# Patient Record
Sex: Female | Born: 1955 | Race: White | Hispanic: No | Smoking: Current every day smoker
Health system: Southern US, Community
[De-identification: ages and names within clinical notes are randomized; demographics above are authoritative.]

## PROBLEM LIST (undated history)

## (undated) DIAGNOSIS — F329 Major depressive disorder, single episode, unspecified: Secondary | ICD-10-CM

## (undated) DIAGNOSIS — R112 Nausea with vomiting, unspecified: Secondary | ICD-10-CM

## (undated) DIAGNOSIS — G43909 Migraine, unspecified, not intractable, without status migrainosus: Secondary | ICD-10-CM

## (undated) DIAGNOSIS — K219 Gastro-esophageal reflux disease without esophagitis: Secondary | ICD-10-CM

## (undated) DIAGNOSIS — F419 Anxiety disorder, unspecified: Secondary | ICD-10-CM

## (undated) DIAGNOSIS — M1990 Unspecified osteoarthritis, unspecified site: Secondary | ICD-10-CM

## (undated) DIAGNOSIS — G971 Other reaction to spinal and lumbar puncture: Secondary | ICD-10-CM

## (undated) DIAGNOSIS — R0602 Shortness of breath: Secondary | ICD-10-CM

## (undated) DIAGNOSIS — E7800 Pure hypercholesterolemia, unspecified: Secondary | ICD-10-CM

## (undated) DIAGNOSIS — R51 Headache: Secondary | ICD-10-CM

## (undated) DIAGNOSIS — J189 Pneumonia, unspecified organism: Secondary | ICD-10-CM

## (undated) DIAGNOSIS — F32A Depression, unspecified: Secondary | ICD-10-CM

## (undated) DIAGNOSIS — I10 Essential (primary) hypertension: Secondary | ICD-10-CM

## (undated) DIAGNOSIS — Z9889 Other specified postprocedural states: Secondary | ICD-10-CM

## (undated) HISTORY — PX: JOINT REPLACEMENT: SHX530

## (undated) HISTORY — PX: FRACTURE SURGERY: SHX138

## (undated) HISTORY — PX: TONSILLECTOMY AND ADENOIDECTOMY: SUR1326

## (undated) HISTORY — PX: ANKLE FRACTURE SURGERY: SHX122

---

## 1981-08-10 HISTORY — PX: LEFT OOPHORECTOMY: SHX1961

## 1983-08-11 HISTORY — PX: TUBAL LIGATION: SHX77

## 1986-08-10 HISTORY — PX: KNEE SURGERY: SHX244

## 1998-01-18 ENCOUNTER — Other Ambulatory Visit: Admission: RE | Admit: 1998-01-18 | Discharge: 1998-01-18 | Payer: Self-pay | Admitting: Obstetrics

## 2008-08-23 ENCOUNTER — Inpatient Hospital Stay (HOSPITAL_COMMUNITY): Admission: RE | Admit: 2008-08-23 | Discharge: 2008-08-26 | Payer: Self-pay | Admitting: Orthopedic Surgery

## 2010-11-24 LAB — URINALYSIS, ROUTINE W REFLEX MICROSCOPIC
Glucose, UA: NEGATIVE mg/dL
Leukocytes, UA: NEGATIVE
pH: 5 (ref 5.0–8.0)

## 2010-11-24 LAB — URINE MICROSCOPIC-ADD ON

## 2010-11-24 LAB — COMPREHENSIVE METABOLIC PANEL
ALT: 26 U/L (ref 0–35)
AST: 26 U/L (ref 0–37)
BUN: 19 mg/dL (ref 6–23)
Calcium: 10.3 mg/dL (ref 8.4–10.5)
Chloride: 106 mEq/L (ref 96–112)
Creatinine, Ser: 0.94 mg/dL (ref 0.4–1.2)
GFR calc non Af Amer: >60 mL/min (ref >60)
Potassium: 4.8 mEq/L (ref 3.5–5.1)
Total Protein: 7.2 g/dL (ref 6.0–8.3)

## 2010-11-24 LAB — PROTIME-INR
INR: 1 (ref 0.00–1.49)
Prothrombin Time: 12.7 seconds (ref 11.6–15.2)
Prothrombin Time: 13.6 seconds (ref 11.6–15.2)
Prothrombin Time: 33.8 seconds — ABNORMAL HIGH (ref 11.6–15.2)

## 2010-11-24 LAB — APTT: aPTT: 31 seconds (ref 24–37)

## 2010-11-24 LAB — CBC
HCT: 45.1 % (ref 36.0–46.0)
Hemoglobin: 15.3 g/dL — ABNORMAL HIGH (ref 12.0–15.0)
Platelets: 256 10*3/uL (ref 150–400)
WBC: 6.3 10*3/uL (ref 4.0–10.5)

## 2010-12-23 NOTE — Op Note (Signed)
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   POSTOPERATIVE DIAGNOSIS:  Osteoarthritis, right ankle secondary to  trauma from previous ankle fracture.   PROCEDURE:  1. Removal of deep-retained hardware.  2. Tibiocalcaneal fusion with a Synthes nail, 10 x 180 mm in length,      locked proximally and distally.   SURGEON:  Nadara Mustard, MD   ANESTHESIA:  General.   ESTIMATED BLOOD LOSS:  Minimal.   ANTIBIOTICS:  Kefzol 2 g.   DRAINS:  None.   COMPLICATIONS:  None.   TOURNIQUET TIME:  None.   DISPOSITION:  To PACU in stable condition.   INDICATIONS FOR PROCEDURE:  The patient is a 55 year old woman who is  status post bimalleolar ankle fracture on the right.  She has undergone  previous open reduction and internal fixation.  However, she has  developed progressive collapse and traumatic arthritis across the right  ankle joint.  She has failed conservative care.  She has pain with  activities of daily living and presents at this time for fusion.  Risks  and benefits were discussed including infection, neurovascular injury,  persistent pain, and need for additional surgery.  The patient states  she understands and wished to proceed at this time.   DESCRIPTION OF PROCEDURE:  The patient was brought to OR room #15 after  undergoing a popliteal block.  She then underwent a general anesthetic.  After adequate level of anesthesia obtained, the patient was placed in  the left lateral decubitus position with the right side up and the right  lower extremity was prepped using  DuraPrep and draped into a sterile  field.  A lateral incision was made laterally over a previous incision  and this was carried down to the fibula.  The oscillating saw was used  to resect the fibula proximal to the deep-retained hardware in the  distal fibula and the hardware was removed in 1 block of tissue.  The  fibrinous tissue around the ankle joint was debrided.  There were 2  fiber wire sutures for a tight rope syndesmosis repair.  There was also  calcification of the syndesmosis.  The calcification was taken down and  the tight robes were removed.  The distal tibia and talar dome were  resected perpendicular to long axis of the tibia with the ankle at 9  degrees.  This was removed in a block of tissue.  Her previous medial  incision was also used and this was carried down.  The medial hardware  was removed and a medial malleolus excision was also performed to allow  for reduction of the ankle.  This allowed the ankle  joint to be reduced  at 90 degrees.  An incision was made plantarly.  Guidewire was inserted  from the calcaneus up into the tibia and this was then overreamed with C-  arm fluoroscopy guidance up to 13 mm.  The 10-mm nail was then inserted  and the ankle was reduced.  This was locked with a 75-mm spiral blade  distally to the calcaneus and locked proximally with a 32-mm 5.0  cortical screw.  C-arm fluoroscopy verified reduction of both AP and  lateral planes.  The wounds were irrigated with normal saline.  The  fusion site was packed with the extra small Infuse graft.  Subcu was  closed using 2-0 Vicryl.  The skin was closed using approximated staples  as well as 2-0 nylon.  The wounds were covered with Adaptic orthopedic  sponges, ABD dressing, Kerlix, and Coban.  The patient was extubated and  taken to the PACU in stable condition.  Planned for discharge to home  once she was safe with ambulation.      Nadara Mustard, MD  Electronically Signed      MVD/MEDQ  D:  08/23/2008  T:  08/24/2008  Job:  [id number]

## 2010-12-26 NOTE — Discharge Summary (Signed)
[name], Dana Gilmore                 ACCOUNT NO.:  [id number]   MEDICAL RECORD NO.:  [id number]          PATIENT TYPE:  INP   LOCATION:  5023                         FACILITY:  [facility]   PHYSICIAN:  Nadara Mustard, MD     DATE OF BIRTH:  01-02-1956   DATE OF ADMISSION:  08/23/2008  DATE OF DISCHARGE:  08/26/2008                               DISCHARGE SUMMARY   FINAL DIAGNOSIS:  Osteoarthritis, right ankle secondary to trauma with  deep retained hardware.   PROCEDURE:  1. Removal of deep retained hardware.  2. Tibiocalcaneal fusion.   Discharge to home in stable condition with home health physical therapy.  Follow up in the office in 1 week after discharge.   HISTORY OF PRESENT ILLNESS:  The patient is a 55 year old woman with  osteoarthritis of the right ankle secondary to trauma.  She has pain  with activities of daily living, unstable leg/left hindfoot presents at  this time for tibiocalcaneal fusion.   The patient's hospital course is essentially unremarkable.  She  underwent tibiocalcaneal fusion on August 23, 2008, after removal of  the deep hardware.  She received general anesthetic as well as a  popliteal block.  She received Kefzol for infection prophylaxis and was  started on Coumadin for DVT prophylaxis.  Tourniquet was not used during  the surgery.  Postoperatively, the patient progressed slowly with her  therapy.  She was ambulating independently on the date of discharge and  was discharged to home in stable condition on August 26, 2008, with  follow up in the office in 1 week, non-weightbearing on the right foot.      Nadara Mustard, MD  Electronically Signed     MVD/MEDQ  D:  10/10/2008  T:  10/10/2008  Job:  [id number]

## 2011-09-14 ENCOUNTER — Emergency Department (INDEPENDENT_AMBULATORY_CARE_PROVIDER_SITE_OTHER): Payer: Medicaid Other

## 2011-09-14 ENCOUNTER — Encounter (HOSPITAL_BASED_OUTPATIENT_CLINIC_OR_DEPARTMENT_OTHER): Payer: Self-pay | Admitting: *Deleted

## 2011-09-14 ENCOUNTER — Emergency Department (HOSPITAL_BASED_OUTPATIENT_CLINIC_OR_DEPARTMENT_OTHER)
Admission: EM | Admit: 2011-09-14 | Discharge: 2011-09-14 | Discharge status: Against Medical Advice | Payer: Medicaid Other | Attending: Emergency Medicine | Admitting: Emergency Medicine

## 2011-09-14 DIAGNOSIS — M25569 Pain in unspecified knee: Secondary | ICD-10-CM | POA: Insufficient documentation

## 2011-09-14 DIAGNOSIS — M25469 Effusion, unspecified knee: Secondary | ICD-10-CM

## 2011-09-14 HISTORY — DX: Unspecified osteoarthritis, unspecified site: M19.90

## 2011-09-14 HISTORY — DX: Anxiety disorder, unspecified: F41.9

## 2011-09-14 HISTORY — DX: Pure hypercholesterolemia, unspecified: E78.00

## 2011-09-14 HISTORY — DX: Essential (primary) hypertension: I10

## 2011-09-14 NOTE — ED Notes (Signed)
Chronic pain. Here today with pain in her right knee. 2 days ago a person tripped over her knee. This am she had swelling.

## 2011-09-14 NOTE — ED Notes (Signed)
Pt not in waiting area. Told registration she had to leave due to transportation issues but she will return tomorrow.

## 2011-09-21 ENCOUNTER — Emergency Department (INDEPENDENT_AMBULATORY_CARE_PROVIDER_SITE_OTHER): Payer: Medicaid Other

## 2011-09-21 ENCOUNTER — Encounter (HOSPITAL_BASED_OUTPATIENT_CLINIC_OR_DEPARTMENT_OTHER): Payer: Self-pay | Admitting: *Deleted

## 2011-09-21 ENCOUNTER — Emergency Department (HOSPITAL_BASED_OUTPATIENT_CLINIC_OR_DEPARTMENT_OTHER)
Admission: EM | Admit: 2011-09-21 | Discharge: 2011-09-21 | Disposition: A | Discharge status: Home / Self Care | Payer: Medicaid Other | Attending: Emergency Medicine | Admitting: Emergency Medicine

## 2011-09-21 DIAGNOSIS — F411 Generalized anxiety disorder: Secondary | ICD-10-CM | POA: Insufficient documentation

## 2011-09-21 DIAGNOSIS — IMO0002 Reserved for concepts with insufficient information to code with codable children: Secondary | ICD-10-CM | POA: Insufficient documentation

## 2011-09-21 DIAGNOSIS — M25469 Effusion, unspecified knee: Secondary | ICD-10-CM

## 2011-09-21 DIAGNOSIS — M25569 Pain in unspecified knee: Secondary | ICD-10-CM

## 2011-09-21 DIAGNOSIS — M7989 Other specified soft tissue disorders: Secondary | ICD-10-CM

## 2011-09-21 DIAGNOSIS — Y93E3 Activity, vacuuming: Secondary | ICD-10-CM | POA: Insufficient documentation

## 2011-09-21 DIAGNOSIS — M1710 Unilateral primary osteoarthritis, unspecified knee: Secondary | ICD-10-CM

## 2011-09-21 DIAGNOSIS — I10 Essential (primary) hypertension: Secondary | ICD-10-CM | POA: Insufficient documentation

## 2011-09-21 DIAGNOSIS — E7800 Pure hypercholesterolemia, unspecified: Secondary | ICD-10-CM | POA: Insufficient documentation

## 2011-09-21 DIAGNOSIS — M2340 Loose body in knee, unspecified knee: Secondary | ICD-10-CM

## 2011-09-21 DIAGNOSIS — S8990XA Unspecified injury of unspecified lower leg, initial encounter: Secondary | ICD-10-CM

## 2011-09-21 DIAGNOSIS — F17200 Nicotine dependence, unspecified, uncomplicated: Secondary | ICD-10-CM | POA: Insufficient documentation

## 2011-09-21 DIAGNOSIS — Y92009 Unspecified place in unspecified non-institutional (private) residence as the place of occurrence of the external cause: Secondary | ICD-10-CM | POA: Insufficient documentation

## 2011-09-21 DIAGNOSIS — G8929 Other chronic pain: Secondary | ICD-10-CM | POA: Insufficient documentation

## 2011-09-21 MED ORDER — OXYCODONE-ACETAMINOPHEN 5-325 MG PO TABS
1.0000 | ORAL_TABLET | Freq: Once | ORAL | Status: AC
  Administered 2011-09-21: 1 via ORAL
  Filled 2011-09-21: qty 1

## 2011-09-21 NOTE — ED Provider Notes (Signed)
Medical screening examination/treatment/procedure(s) were performed by non-physician practitioner and as supervising physician I was immediately available for consultation/collaboration.  Addilynne Olheiser R. Raelene Trew, MD 09/21/11 1443 

## 2011-09-21 NOTE — ED Notes (Signed)
The patient know and stated that she understands that with the knee immobilizer on her leg,and her leg brace will not fit at the same time. She is unable to use both of the braces together.

## 2011-09-21 NOTE — ED Provider Notes (Signed)
History     CSN: [id number]  Arrival date & time 09/21/11  8657   First MD Initiated Contact with Patient 09/21/11 0957      Chief Complaint  Patient presents with  . Knee Pain    (Consider location/radiation/quality/duration/timing/severity/associated sxs/prior treatment) HPI  Pt presents to the ED with right knee pain. She came to the ER for right knee pain and had xrays done but left before being seen on 09/14/2011 after her husband hit her knee vacuuming. Last night the patient got up to go to the bathroom at 2am and her knee gave out. She was able to catch herself on the hand rails and did not fall. Since then she has not iced her knee. She is unable to put pressure on it. Upon entering exam room I note that their is significant swelling to proximal lateral side. She denies syncope causing the fall, denies any other injuries, or any other complaints. Patient has walker at home if she is able to use.  Past Medical History  Diagnosis Date  . Chronic pain   . Hypertension   . High cholesterol   . Anxiety   . Arthritis     Past Surgical History  Procedure Date  . Knee surgery   . Tonsillectomy     History reviewed. No pertinent family history.  History  Substance Use Topics  . Smoking status: Current Everyday Smoker -- 0.5 packs/day  . Smokeless tobacco: Not on file  . Alcohol Use: No    OB History    Grav Para Term Preterm Abortions TAB SAB Ect Mult Living                  Review of Systems  All other systems reviewed and are negative.    Allergies  Ibuprofen; Lyrica; Neurontin; and Tylenol  Home Medications   Current Outpatient Rx  Name Route Sig Dispense Refill  . VENTOLIN IN Inhalation Inhale into the lungs.    . AMLODIPINE BESYLATE PO Oral Take by mouth.    . CLONAZEPAM PO Oral Take by mouth.    . DICLOFENAC SODIUM 1 % TD GEL Topical Apply topically.    Marland Kitchen NORTRIPTYLINE HCL PO Oral Take by mouth.    . OMEPRAZOLE PO Oral Take by mouth.    .  OXYCODONE HCL PO Oral Take by mouth.    Marland Kitchen SIMVASTATIN PO Oral Take by mouth.      BP 138/88  Pulse 86  Temp(Src) 98.5 F (36.9 C) (Oral)  Resp 18  SpO2 96%  Physical Exam  Constitutional: She is oriented to person, place, and time. She appears well-developed and well-nourished.  HENT:  Head: Normocephalic and atraumatic.  Eyes: Pupils are equal, round, and reactive to light.  Neck: Trachea normal, normal range of motion and full passive range of motion without pain. Neck supple.  Cardiovascular: Normal rate, regular rhythm and normal pulses.   Pulmonary/Chest: Effort normal. Chest wall is not dull to percussion. She exhibits no crepitus, no edema, no deformity and no retraction.  Abdominal: Soft. Normal appearance.  Musculoskeletal:       Right knee: She exhibits decreased range of motion, swelling and effusion. She exhibits no ecchymosis, no deformity, no laceration, no erythema, normal alignment, no LCL laxity, normal patellar mobility and no bony tenderness. tenderness found. LCL tenderness noted. No patellar tendon tenderness noted.       Legs: Neurological: She is oriented to person, place, and time. She has normal strength.  Skin: Skin  is warm, dry and intact.  Psychiatric: Her speech is normal. Cognition and memory are normal.    ED Course  Procedures (including critical care time)  Labs Reviewed - No data to display Dg Knee Complete 4 Views Right  09/21/2011  *RADIOLOGY REPORT*  Clinical Data: Knee pain, swelling  RIGHT KNEE - COMPLETE 4+ VIEW  Comparison: Right knee films of 09/14/2011  Findings: As noted previously there is moderate tricompartmental degenerative joint disease with loss of joint space and spurring. On several views there is suggestion of a possible loose body within the joint space.  Patellar spurring is noted superiorly and there is a moderate sized left knee joint effusion present.  No evidence of dislocation of the patella is seen.  IMPRESSION:  1.  No  dislocation is seen. 2.  Moderate tricompartmental degenerative joint disease. 3.  Moderate right knee joint effusion. 4.  Question loose body and joint space.  Original Report Authenticated By: Juline Patch, M.D.     1. Loose body in knee   2. Knee injury       MDM     Patient Complaints #1 right knee pain  Plan  Pt placed in knee immobilizer and to call orthopedist (Dr. Lajoyce Corners) today. Pt has percocet at home. Pt has walker at home. Pt informed to rest, ice and elevate knee as much as possible  Referrals [facility], Dr. Lajoyce Corners  Prescriptions None  Continue these medications as noted  My, Rinke Medication Instructions ZOX:[id number]   Printed on:09/21/11 1114  Medication Information                    Albuterol (VENTOLIN IN) Inhale into the lungs.           AMLODIPINE BESYLATE PO Take by mouth.           CLONAZEPAM PO Take by mouth.           diclofenac sodium (VOLTAREN) 1 % GEL Apply topically.           NORTRIPTYLINE HCL PO Take by mouth.           OMEPRAZOLE PO Take by mouth.           OXYCODONE HCL PO Take by mouth.           SIMVASTATIN PO Take by mouth.             Pt has been given return to ER precautions. Pt is agreeable with plan and will return to the ED as needed for any changing or worsening of symptoms.         Dorthula Matas, PA 09/21/11 1118

## 2011-09-21 NOTE — ED Notes (Signed)
Pt to room 3 in w/c, reports twisting her knee when her husband fell over her leg brace on 2/2, has had increasing pain since then. Pt states that last night "it just give out" pt states she did not fall to the ground, but stumbled and caught her balance.

## 2011-11-18 ENCOUNTER — Other Ambulatory Visit (HOSPITAL_COMMUNITY): Payer: Self-pay | Admitting: Orthopedic Surgery

## 2011-11-24 ENCOUNTER — Encounter (HOSPITAL_COMMUNITY): Payer: Self-pay | Admitting: Pharmacy Technician

## 2011-12-03 ENCOUNTER — Encounter (HOSPITAL_COMMUNITY)
Admission: RE | Admit: 2011-12-03 | Discharge: 2011-12-03 | Disposition: A | Discharge status: Home / Self Care | Payer: Medicaid Other | Source: Ambulatory Visit | Attending: Orthopedic Surgery | Admitting: Orthopedic Surgery

## 2011-12-03 ENCOUNTER — Encounter (HOSPITAL_COMMUNITY): Payer: Self-pay

## 2011-12-03 HISTORY — DX: Headache: R51

## 2011-12-03 HISTORY — DX: Gastro-esophageal reflux disease without esophagitis: K21.9

## 2011-12-03 HISTORY — DX: Other reaction to spinal and lumbar puncture: G97.1

## 2011-12-03 HISTORY — DX: Shortness of breath: R06.02

## 2011-12-03 HISTORY — DX: Depression, unspecified: F32.A

## 2011-12-03 HISTORY — DX: Major depressive disorder, single episode, unspecified: F32.9

## 2011-12-03 LAB — COMPREHENSIVE METABOLIC PANEL
AST: 24 U/L (ref 0–37)
Albumin: 4.1 g/dL (ref 3.5–5.2)
Alkaline Phosphatase: 119 U/L — ABNORMAL HIGH (ref 39–117)
Chloride: 101 mEq/L (ref 96–112)
Potassium: 4.4 mEq/L (ref 3.5–5.1)
Sodium: 140 mEq/L (ref 135–145)
Total Bilirubin: 0.3 mg/dL (ref 0.3–1.2)
Total Protein: 7.5 g/dL (ref 6.0–8.3)

## 2011-12-03 LAB — CBC
MCHC: 32.6 g/dL (ref 30.0–36.0)
Platelets: 238 10*3/uL (ref 150–400)
RDW: 14.6 % (ref 11.5–15.5)
WBC: 6.1 10*3/uL (ref 4.0–10.5)

## 2011-12-03 LAB — ABO/RH: ABO/RH(D): O POS

## 2011-12-03 LAB — TYPE AND SCREEN: Antibody Screen: NEGATIVE

## 2011-12-03 LAB — SURGICAL PCR SCREEN
MRSA, PCR: NEGATIVE
Staphylococcus aureus: NEGATIVE

## 2011-12-03 LAB — PROTIME-INR: Prothrombin Time: 13.1 seconds (ref 11.6–15.2)

## 2011-12-03 LAB — APTT: aPTT: 34 seconds (ref 24–37)

## 2011-12-03 NOTE — Progress Notes (Signed)
Dr furr called for current ekg  And stress test.

## 2011-12-03 NOTE — Pre-Procedure Instructions (Signed)
20 Dana Gilmore  12/03/2011   Your procedure is scheduled on:  12/09/11  Report to [facility] at 630 AM.  Call this number if you have problems the morning of surgery: [phone]   Remember:   Do not eat food:After Midnight.  May have clear liquids: up to 4 Hours before arrival.  Clear liquids include soda, tea, black coffee, apple or grape juice, broth.  Take these medicines the morning of surgery with A SIP OF WATER: all inhalers,prilosec,percocet   Do not wear jewelry, make-up or nail polish.  Do not wear lotions, powders, or perfumes. You may wear deodorant.  Do not shave 48 hours prior to surgery.  Do not bring valuables to the hospital.  Contacts, dentures or bridgework may not be worn into surgery.  Leave suitcase in the car. After surgery it may be brought to your room.  For patients admitted to the hospital, checkout time is 11:00 AM the day of discharge.   Patients discharged the day of surgery will not be allowed to drive home.  Name and phone number of your driver: family  Special Instructions: CHG Shower Use Special Wash: 1/2 bottle night before surgery and 1/2 bottle morning of surgery.   Please read over the following fact sheets that you were given: Pain Booklet, Coughing and Deep Breathing, Blood Transfusion Information, Total Joint Packet, MRSA Information and Surgical Site Infection Prevention

## 2011-12-08 MED ORDER — CEFAZOLIN SODIUM-DEXTROSE 2-3 GM-% IV SOLR
2.0000 g | INTRAVENOUS | Status: AC
  Administered 2011-12-09: 2 g via INTRAVENOUS
  Filled 2011-12-08: qty 50

## 2011-12-09 ENCOUNTER — Encounter (HOSPITAL_COMMUNITY)
Admission: RE | Disposition: A | Discharge status: Home Health | Payer: Self-pay | Source: Ambulatory Visit | Attending: Orthopedic Surgery

## 2011-12-09 ENCOUNTER — Encounter (HOSPITAL_COMMUNITY): Payer: Self-pay

## 2011-12-09 ENCOUNTER — Ambulatory Visit (HOSPITAL_COMMUNITY): Payer: Medicaid Other | Admitting: Anesthesiology

## 2011-12-09 ENCOUNTER — Encounter (HOSPITAL_COMMUNITY): Payer: Self-pay | Admitting: Anesthesiology

## 2011-12-09 ENCOUNTER — Inpatient Hospital Stay (HOSPITAL_COMMUNITY): Payer: Medicaid Other

## 2011-12-09 ENCOUNTER — Inpatient Hospital Stay (HOSPITAL_COMMUNITY)
Admission: RE | Admit: 2011-12-09 | Discharge: 2011-12-12 | DRG: 470 | Disposition: A | Discharge status: Home Health | Payer: Medicaid Other | Source: Ambulatory Visit | Attending: Orthopedic Surgery | Admitting: Orthopedic Surgery

## 2011-12-09 DIAGNOSIS — E7800 Pure hypercholesterolemia, unspecified: Secondary | ICD-10-CM | POA: Diagnosis present

## 2011-12-09 DIAGNOSIS — Z90710 Acquired absence of both cervix and uterus: Secondary | ICD-10-CM

## 2011-12-09 DIAGNOSIS — I10 Essential (primary) hypertension: Secondary | ICD-10-CM | POA: Diagnosis present

## 2011-12-09 DIAGNOSIS — F411 Generalized anxiety disorder: Secondary | ICD-10-CM | POA: Diagnosis present

## 2011-12-09 DIAGNOSIS — Z87891 Personal history of nicotine dependence: Secondary | ICD-10-CM

## 2011-12-09 DIAGNOSIS — M1710 Unilateral primary osteoarthritis, unspecified knee: Principal | ICD-10-CM | POA: Diagnosis present

## 2011-12-09 DIAGNOSIS — F329 Major depressive disorder, single episode, unspecified: Secondary | ICD-10-CM | POA: Diagnosis present

## 2011-12-09 DIAGNOSIS — R0602 Shortness of breath: Secondary | ICD-10-CM

## 2011-12-09 DIAGNOSIS — K219 Gastro-esophageal reflux disease without esophagitis: Secondary | ICD-10-CM | POA: Diagnosis present

## 2011-12-09 DIAGNOSIS — G8929 Other chronic pain: Secondary | ICD-10-CM | POA: Diagnosis present

## 2011-12-09 DIAGNOSIS — Z96659 Presence of unspecified artificial knee joint: Secondary | ICD-10-CM

## 2011-12-09 DIAGNOSIS — G43909 Migraine, unspecified, not intractable, without status migrainosus: Secondary | ICD-10-CM

## 2011-12-09 DIAGNOSIS — F3289 Other specified depressive episodes: Secondary | ICD-10-CM | POA: Diagnosis present

## 2011-12-09 HISTORY — DX: Other specified postprocedural states: Z98.890

## 2011-12-09 HISTORY — DX: Nausea with vomiting, unspecified: R11.2

## 2011-12-09 HISTORY — PX: REPLACEMENT TOTAL KNEE: SUR1224

## 2011-12-09 HISTORY — DX: Shortness of breath: R06.02

## 2011-12-09 HISTORY — DX: Pneumonia, unspecified organism: J18.9

## 2011-12-09 HISTORY — PX: TOTAL KNEE ARTHROPLASTY: SHX125

## 2011-12-09 HISTORY — DX: Migraine, unspecified, not intractable, without status migrainosus: G43.909

## 2011-12-09 SURGERY — ARTHROPLASTY, KNEE, TOTAL
Anesthesia: General | Site: Knee | Laterality: Right | Wound class: Clean

## 2011-12-09 MED ORDER — FENTANYL CITRATE 0.05 MG/ML IJ SOLN
INTRAMUSCULAR | Status: DC | PRN
  Administered 2011-12-09: 150 ug via INTRAVENOUS
  Administered 2011-12-09: 100 ug via INTRAVENOUS
  Administered 2011-12-09: 150 ug via INTRAVENOUS

## 2011-12-09 MED ORDER — ALBUTEROL SULFATE HFA 108 (90 BASE) MCG/ACT IN AERS: 2.0000 | INHALATION_SPRAY | Freq: Four times a day (QID) | RESPIRATORY_TRACT | Status: DC | PRN

## 2011-12-09 MED ORDER — METOCLOPRAMIDE HCL 10 MG PO TABS: 5.0000 mg | ORAL_TABLET | Freq: Three times a day (TID) | ORAL | Status: DC | PRN

## 2011-12-09 MED ORDER — FLUTICASONE PROPIONATE HFA 44 MCG/ACT IN AERO
1.0000 | INHALATION_SPRAY | Freq: Two times a day (BID) | RESPIRATORY_TRACT | Status: DC
  Administered 2011-12-09 – 2011-12-12 (×6): 1 via RESPIRATORY_TRACT
  Filled 2011-12-09: qty 10.6

## 2011-12-09 MED ORDER — ATORVASTATIN CALCIUM 20 MG PO TABS
20.0000 mg | ORAL_TABLET | Freq: Every day | ORAL | Status: DC
  Administered 2011-12-09 – 2011-12-11 (×3): 20 mg via ORAL
  Filled 2011-12-09 (×4): qty 1

## 2011-12-09 MED ORDER — SODIUM CHLORIDE 0.9 % IV SOLN
INTRAVENOUS | Status: DC
  Administered 2011-12-09: 20 mL/h via INTRAVENOUS

## 2011-12-09 MED ORDER — CLONAZEPAM 1 MG PO TABS
2.0000 mg | ORAL_TABLET | Freq: Two times a day (BID) | ORAL | Status: DC
  Administered 2011-12-09 – 2011-12-12 (×6): 2 mg via ORAL
  Filled 2011-12-09 (×4): qty 2
  Filled 2011-12-09 (×2): qty 1
  Filled 2011-12-09: qty 2

## 2011-12-09 MED ORDER — WARFARIN SODIUM 7.5 MG PO TABS
7.5000 mg | ORAL_TABLET | Freq: Once | ORAL | Status: AC
  Administered 2011-12-09: 7.5 mg via ORAL
  Filled 2011-12-09: qty 1

## 2011-12-09 MED ORDER — HYDROCODONE-ACETAMINOPHEN 10-325 MG PO TABS
1.0000 | ORAL_TABLET | ORAL | Status: DC | PRN
  Administered 2011-12-10 – 2011-12-12 (×10): 2 via ORAL
  Administered 2011-12-12: 1 via ORAL
  Filled 2011-12-09 (×12): qty 2

## 2011-12-09 MED ORDER — METHOCARBAMOL 500 MG PO TABS
500.0000 mg | ORAL_TABLET | Freq: Four times a day (QID) | ORAL | Status: DC | PRN
  Administered 2011-12-10 – 2011-12-12 (×7): 500 mg via ORAL
  Filled 2011-12-09 (×8): qty 1

## 2011-12-09 MED ORDER — BUPIVACAINE-EPINEPHRINE PF 0.5-1:200000 % IJ SOLN
INTRAMUSCULAR | Status: DC | PRN
  Administered 2011-12-09: 25 mL

## 2011-12-09 MED ORDER — BENAZEPRIL HCL 10 MG PO TABS
10.0000 mg | ORAL_TABLET | Freq: Every day | ORAL | Status: DC
  Filled 2011-12-09 (×3): qty 1

## 2011-12-09 MED ORDER — SUCCINYLCHOLINE CHLORIDE 20 MG/ML IJ SOLN
INTRAMUSCULAR | Status: DC | PRN
  Administered 2011-12-09: 140 mg via INTRAVENOUS

## 2011-12-09 MED ORDER — PROMETHAZINE HCL 25 MG PO TABS: 25.0000 mg | ORAL_TABLET | Freq: Four times a day (QID) | ORAL | Status: DC | PRN

## 2011-12-09 MED ORDER — PROPOFOL 10 MG/ML IV EMUL
INTRAVENOUS | Status: DC | PRN
  Administered 2011-12-09: 200 mg via INTRAVENOUS

## 2011-12-09 MED ORDER — METHOCARBAMOL 100 MG/ML IJ SOLN
500.0000 mg | INTRAVENOUS | Status: AC
  Administered 2011-12-09: 500 mg via INTRAVENOUS
  Filled 2011-12-09: qty 5

## 2011-12-09 MED ORDER — DEXTROSE 5 % IV SOLN
INTRAVENOUS | Status: DC | PRN
  Administered 2011-12-09: 09:00:00 via INTRAVENOUS

## 2011-12-09 MED ORDER — LACTATED RINGERS IV SOLN
INTRAVENOUS | Status: DC | PRN
  Administered 2011-12-09 (×3): via INTRAVENOUS

## 2011-12-09 MED ORDER — HYDROMORPHONE HCL PF 1 MG/ML IJ SOLN
0.2500 mg | INTRAMUSCULAR | Status: DC | PRN
  Administered 2011-12-09 (×4): 0.5 mg via INTRAVENOUS

## 2011-12-09 MED ORDER — WARFARIN VIDEO
1.0000 | Freq: Once | Status: AC
  Administered 2011-12-10: 1

## 2011-12-09 MED ORDER — WARFARIN - PHARMACIST DOSING INPATIENT: Freq: Every day | Status: DC

## 2011-12-09 MED ORDER — ONDANSETRON HCL 4 MG PO TABS: 4.0000 mg | ORAL_TABLET | Freq: Four times a day (QID) | ORAL | Status: DC | PRN

## 2011-12-09 MED ORDER — MIDAZOLAM HCL 5 MG/5ML IJ SOLN
INTRAMUSCULAR | Status: DC | PRN
  Administered 2011-12-09: 2 mg via INTRAVENOUS

## 2011-12-09 MED ORDER — AMLODIPINE BESY-BENAZEPRIL HCL 5-10 MG PO CAPS: 1.0000 | ORAL_CAPSULE | Freq: Every morning | ORAL | Status: DC

## 2011-12-09 MED ORDER — SUMATRIPTAN SUCCINATE 25 MG PO TABS
25.0000 mg | ORAL_TABLET | ORAL | Status: DC | PRN
  Administered 2011-12-09: 25 mg via ORAL
  Filled 2011-12-09: qty 1

## 2011-12-09 MED ORDER — ROCURONIUM BROMIDE 100 MG/10ML IV SOLN
INTRAVENOUS | Status: DC | PRN
  Administered 2011-12-09 (×3): 10 mg via INTRAVENOUS
  Administered 2011-12-09: 40 mg via INTRAVENOUS

## 2011-12-09 MED ORDER — ONDANSETRON HCL 4 MG/2ML IJ SOLN
INTRAMUSCULAR | Status: DC | PRN
  Administered 2011-12-09: 4 mg via INTRAVENOUS

## 2011-12-09 MED ORDER — MORPHINE SULFATE 2 MG/ML IJ SOLN: 5.0000 mg | INTRAMUSCULAR | Status: DC | PRN

## 2011-12-09 MED ORDER — GLYCOPYRROLATE 0.2 MG/ML IJ SOLN
INTRAMUSCULAR | Status: DC | PRN
  Administered 2011-12-09: .5 mg via INTRAVENOUS

## 2011-12-09 MED ORDER — COUMADIN BOOK
1.0000 | Freq: Once | Status: AC
  Administered 2011-12-09: 1
  Filled 2011-12-09: qty 1

## 2011-12-09 MED ORDER — CEFAZOLIN SODIUM-DEXTROSE 2-3 GM-% IV SOLR
2.0000 g | Freq: Four times a day (QID) | INTRAVENOUS | Status: AC
  Administered 2011-12-09 – 2011-12-10 (×3): 2 g via INTRAVENOUS
  Filled 2011-12-09 (×3): qty 50

## 2011-12-09 MED ORDER — DROPERIDOL 2.5 MG/ML IJ SOLN
INTRAMUSCULAR | Status: DC | PRN
  Administered 2011-12-09: 0.625 mg via INTRAVENOUS

## 2011-12-09 MED ORDER — ONDANSETRON HCL 4 MG/2ML IJ SOLN: 4.0000 mg | Freq: Once | INTRAMUSCULAR | Status: DC | PRN

## 2011-12-09 MED ORDER — HYDROMORPHONE HCL PF 1 MG/ML IJ SOLN
0.5000 mg | INTRAMUSCULAR | Status: DC | PRN
  Administered 2011-12-09 – 2011-12-11 (×8): 1 mg via INTRAVENOUS
  Filled 2011-12-09 (×8): qty 1

## 2011-12-09 MED ORDER — PNEUMOCOCCAL VAC POLYVALENT 25 MCG/0.5ML IJ INJ
0.5000 mL | INJECTION | INTRAMUSCULAR | Status: DC
  Filled 2011-12-09: qty 0.5

## 2011-12-09 MED ORDER — METHOCARBAMOL 100 MG/ML IJ SOLN: 500.0000 mg | Freq: Four times a day (QID) | INTRAVENOUS | Status: DC | PRN

## 2011-12-09 MED ORDER — ONDANSETRON HCL 4 MG/2ML IJ SOLN: 4.0000 mg | Freq: Four times a day (QID) | INTRAMUSCULAR | Status: DC | PRN

## 2011-12-09 MED ORDER — OXYCODONE-ACETAMINOPHEN 5-325 MG PO TABS
1.0000 | ORAL_TABLET | ORAL | Status: DC | PRN
  Administered 2011-12-09 – 2011-12-12 (×5): 2 via ORAL
  Filled 2011-12-09 (×6): qty 2

## 2011-12-09 MED ORDER — AMLODIPINE BESYLATE 5 MG PO TABS
5.0000 mg | ORAL_TABLET | Freq: Every day | ORAL | Status: DC
  Filled 2011-12-09 (×3): qty 1

## 2011-12-09 MED ORDER — SIMVASTATIN 40 MG PO TABS
40.0000 mg | ORAL_TABLET | Freq: Every evening | ORAL | Status: DC
  Filled 2011-12-09: qty 1

## 2011-12-09 MED ORDER — METOCLOPRAMIDE HCL 5 MG/ML IJ SOLN: 5.0000 mg | Freq: Three times a day (TID) | INTRAMUSCULAR | Status: DC | PRN

## 2011-12-09 MED ORDER — NEOSTIGMINE METHYLSULFATE 1 MG/ML IJ SOLN
INTRAMUSCULAR | Status: DC | PRN
  Administered 2011-12-09: 4 mg via INTRAVENOUS

## 2011-12-09 MED ORDER — PANTOPRAZOLE SODIUM 40 MG PO TBEC
40.0000 mg | DELAYED_RELEASE_TABLET | Freq: Every day | ORAL | Status: DC
  Administered 2011-12-10 – 2011-12-11 (×2): 40 mg via ORAL
  Filled 2011-12-09 (×2): qty 1

## 2011-12-09 SURGICAL SUPPLY — 54 items
BLADE SAGITTAL 25.0X1.27X90 (BLADE) ×2 IMPLANT
BLADE SAW SAG 90X13X1.27 (BLADE) IMPLANT
BLADE SAW SGTL 13.0X1.19X90.0M (BLADE) ×2 IMPLANT
BLADE SURG 21 STRL SS (BLADE) ×4 IMPLANT
BNDG COHESIVE 6X5 TAN NS LF (GAUZE/BANDAGES/DRESSINGS) ×2 IMPLANT
BNDG COHESIVE 6X5 TAN STRL LF (GAUZE/BANDAGES/DRESSINGS) ×4 IMPLANT
BONE CEMENT PALACOSE (Orthopedic Implant) ×4 IMPLANT
BOWL SMART MIX CTS (DISPOSABLE) IMPLANT
CEMENT BONE PALACOSE (Orthopedic Implant) ×2 IMPLANT
CLOTH BEACON ORANGE TIMEOUT ST (SAFETY) ×2 IMPLANT
COVER BACK TABLE 24X17X13 BIG (DRAPES) IMPLANT
COVER SURGICAL LIGHT HANDLE (MISCELLANEOUS) ×2 IMPLANT
CUFF TOURNIQUET SINGLE 34IN LL (TOURNIQUET CUFF) IMPLANT
CUFF TOURNIQUET SINGLE 44IN (TOURNIQUET CUFF) IMPLANT
DRAPE EXTREMITY T 121X128X90 (DRAPE) ×2 IMPLANT
DRAPE PROXIMA HALF (DRAPES) ×2 IMPLANT
DRAPE U-SHAPE 47X51 STRL (DRAPES) ×2 IMPLANT
DRSG ADAPTIC 3X8 NADH LF (GAUZE/BANDAGES/DRESSINGS) ×2 IMPLANT
DRSG PAD ABDOMINAL 8X10 ST (GAUZE/BANDAGES/DRESSINGS) ×2 IMPLANT
DURAPREP 26ML APPLICATOR (WOUND CARE) ×2 IMPLANT
ELECT REM PT RETURN 9FT ADLT (ELECTROSURGICAL) ×2
ELECTRODE REM PT RTRN 9FT ADLT (ELECTROSURGICAL) ×1 IMPLANT
FACESHIELD LNG OPTICON STERILE (SAFETY) ×2 IMPLANT
GLOVE BIOGEL PI IND STRL 9 (GLOVE) ×1 IMPLANT
GLOVE BIOGEL PI INDICATOR 9 (GLOVE) ×1
GLOVE SURG ORTHO 9.0 STRL STRW (GLOVE) ×2 IMPLANT
GOWN PREVENTION PLUS XLARGE (GOWN DISPOSABLE) ×2 IMPLANT
GOWN SRG XL XLNG 56XLVL 4 (GOWN DISPOSABLE) ×2 IMPLANT
GOWN STRL NON-REIN XL XLG LVL4 (GOWN DISPOSABLE) ×2
HANDPIECE INTERPULSE COAX TIP (DISPOSABLE)
KIT BASIN OR (CUSTOM PROCEDURE TRAY) ×2 IMPLANT
KIT ROOM TURNOVER OR (KITS) ×2 IMPLANT
MANIFOLD NEPTUNE II (INSTRUMENTS) ×2 IMPLANT
NEEDLE SPNL 18GX3.5 QUINCKE PK (NEEDLE) ×2 IMPLANT
NS IRRIG 1000ML POUR BTL (IV SOLUTION) ×2 IMPLANT
PACK TOTAL JOINT (CUSTOM PROCEDURE TRAY) ×2 IMPLANT
PAD ARMBOARD 7.5X6 YLW CONV (MISCELLANEOUS) ×4 IMPLANT
PADDING CAST ABS 6INX4YD NS (CAST SUPPLIES) ×1
PADDING CAST ABS COTTON 6X4 NS (CAST SUPPLIES) ×1 IMPLANT
PADDING CAST COTTON 6X4 STRL (CAST SUPPLIES) ×2 IMPLANT
SET HNDPC FAN SPRY TIP SCT (DISPOSABLE) IMPLANT
SPONGE GAUZE 4X4 12PLY (GAUZE/BANDAGES/DRESSINGS) ×2 IMPLANT
STAPLER VISISTAT 35W (STAPLE) ×2 IMPLANT
SUCTION FRAZIER TIP 10 FR DISP (SUCTIONS) IMPLANT
SUT VIC AB 0 CTB1 27 (SUTURE) IMPLANT
SUT VIC AB 1 CTX 36 (SUTURE)
SUT VIC AB 1 CTX36XBRD ANBCTR (SUTURE) IMPLANT
SUT VIC AB 2-0 CTB1 (SUTURE) IMPLANT
SYR 50ML SLIP (SYRINGE) ×2 IMPLANT
TOWEL OR 17X24 6PK STRL BLUE (TOWEL DISPOSABLE) ×2 IMPLANT
TOWEL OR 17X26 10 PK STRL BLUE (TOWEL DISPOSABLE) ×2 IMPLANT
TRAY FOLEY CATH 14FR (SET/KITS/TRAYS/PACK) IMPLANT
WATER STERILE IRR 1000ML POUR (IV SOLUTION) ×6 IMPLANT
WRAP KNEE MAXI GEL POST OP (GAUZE/BANDAGES/DRESSINGS) ×2 IMPLANT

## 2011-12-09 NOTE — Consult Note (Signed)
ANTICOAGULATION CONSULT NOTE - Initial Consult  Pharmacy Consult for : Coumadin Indication: VTE prophylaxis  Allergies  Allergen Reactions  . Ibuprofen     Hypertension   . Neurontin (Gabapentin)     hypertension  . Pregabalin     hypertension  . Tylenol (Acetaminophen)     No other than what is in her medication    Patient Measurements: Height: 6\' 1"  (185.4 cm) Weight: 228 lb (103.42 kg) IBW/kg (Calculated) : 75.4   Vital Signs: Temp: 97.9 F (36.6 C) (05/01 1412) Temp src: Oral (05/01 1412) BP: 115/68 mmHg (05/01 1412) Pulse Rate: 68  (05/01 1412)  Labs: Lab Results  Component Value Date   INR 0.97 12/03/2011   INR 3.0* 08/26/2008   INR 1.8* 08/25/2008   Lab Results  Component Value Date   HGB 14.3 12/03/2011   Lab Results  Component Value Date   PLT 238 12/03/2011     Estimated Creatinine Clearance: 93.4 ml/min (by C-G formula based on Cr of 0.93).  Medical / Surgical History: Past Medical History  Diagnosis Date  . Chronic pain   . High cholesterol   . Anxiety   . Arthritis   . Depression   . Headache   . Spinal headache   . Hypertension     dr Huntley Dec furr    [facility]  [phone]  . Shortness of breath   . GERD (gastroesophageal reflux disease)    Past Surgical History  Procedure Date  . Knee surgery   . Tonsillectomy   . Abdominal hysterectomy   . Joint replacement     lt knee   anthroscopy  . Ankle surgery     rt ankle     x3   surgeries   from fall    Medications:  Prescriptions prior to admission  Medication Sig Dispense Refill  . albuterol (PROVENTIL HFA;VENTOLIN HFA) 108 (90 BASE) MCG/ACT inhaler Inhale 2 puffs into the lungs every 6 (six) hours as needed. For shortness of breath      . amLODipine-benazepril (LOTREL) 5-10 MG per capsule Take 1 capsule by mouth every morning.      . beclomethasone (QVAR) 40 MCG/ACT inhaler Inhale 2 puffs into the lungs daily as needed. For shortness of breath      . clonazePAM (KLONOPIN) 2 MG  tablet Take 2 mg by mouth 2 (two) times daily.      . diclofenac sodium (VOLTAREN) 1 % GEL Apply 1 application topically 2 (two) times daily.       Marland Kitchen omeprazole (PRILOSEC) 20 MG capsule Take 20 mg by mouth 2 (two) times daily.      Marland Kitchen oxyCODONE-acetaminophen (PERCOCET) 10-325 MG per tablet Take 1 tablet by mouth 4 (four) times daily.      . promethazine (PHENERGAN) 25 MG tablet Take 25 mg by mouth every 6 (six) hours as needed. For nausea      . simvastatin (ZOCOR) 40 MG tablet Take 40 mg by mouth every evening.       Scheduled:    . amLODipine  5 mg Oral Daily  . benazepril  10 mg Oral Daily  .  ceFAZolin (ANCEF) IV  2 g Intravenous 60 min Pre-Op  .  ceFAZolin (ANCEF) IV  2 g Intravenous Q6H  . clonazePAM  2 mg Oral BID  . fluticasone  1 puff Inhalation BID  . methocarbamol (ROBAXIN) IV  500 mg Intravenous To PACU  . pantoprazole  40 mg Oral Q1200  .  simvastatin  40 mg Oral QPM  . DISCONTD: amLODipine-benazepril  1 capsule Oral q morning - 10a   Anti-infectives     Start     Dose/Rate Route Frequency Ordered Stop   12/09/11 1430   ceFAZolin (ANCEF) IVPB 2 g/50 mL premix        2 g 100 mL/hr over 30 Minutes Intravenous Every 6 hours 12/09/11 1249 12/10/11 0829   12/08/11 1418   ceFAZolin (ANCEF) IVPB 2 g/50 mL premix        2 g 100 mL/hr over 30 Minutes Intravenous 60 min pre-op 12/08/11 1418 12/09/11 0835          Assessment:  56 y/o white female s/p R-TKA placed on Coumadin for VTE prophylaxis.  She has been ordered TED's.  Baseline INR  0.97, Hgb 14.3, Plt. 238  Coumadin predictor score = 5  Goal of Therapy:   INR 2-3   Plan:   Coumadin 7.5 mg po today. Daily INR's, CBC.  Warf ED initiated  Laurena Bering, Pharm.D. 12/09/2011 2:47 PM

## 2011-12-09 NOTE — Anesthesia Postprocedure Evaluation (Signed)
  Anesthesia Post-op Note  Patient: Dana Gilmore  Procedure(s) Performed: Procedure(s) (LRB): TOTAL KNEE ARTHROPLASTY (Right)  Patient Location: PACU  Anesthesia Type: GA combined with regional for post-op pain  Level of Consciousness: awake, alert  and oriented  Airway and Oxygen Therapy: Patient Spontanous Breathing and Patient connected to nasal cannula oxygen  Post-op Pain: mild  Post-op Assessment: Post-op Vital signs reviewed  Post-op Vital Signs: Reviewed  Complications: No apparent anesthesia complications

## 2011-12-09 NOTE — H&P (Signed)
Dana Gilmore is an 56 y.o. female.   Chief Complaint: Pain osteoarthritis right knee. HPI: Patient is a 56 year old woman who has had osteoarthritis of her right knee. She has failed prolonged conservative care including activity modifications assistive devices anti-inflammatories injections all without relief. Due to pain with activities of daily living patient wished to proceed with total knee arthroplasty.  Past Medical History  Diagnosis Date  . Chronic pain   . High cholesterol   . Anxiety   . Arthritis   . Depression   . Headache   . Spinal headache   . Hypertension     dr Huntley Dec furr    [facility]  [phone]  . Shortness of breath   . GERD (gastroesophageal reflux disease)     Past Surgical History  Procedure Date  . Knee surgery   . Tonsillectomy   . Abdominal hysterectomy   . Joint replacement     lt knee   anthroscopy  . Ankle surgery     rt ankle     x3   surgeries   from fall    No family history on file. Social History:  reports that she quit smoking about 5 weeks ago. Her smoking use included Cigarettes. She smoked .5 packs per day. She uses smokeless tobacco. She reports that she drinks alcohol. She reports that she does not use illicit drugs.  Allergies:  Allergies  Allergen Reactions  . Ibuprofen     Hypertension   . Neurontin (Gabapentin)     hypertension  . Pregabalin     hypertension  . Tylenol (Acetaminophen)     No other than what is in her medication    Medications Prior to Admission  Medication Sig Dispense Refill  . albuterol (PROVENTIL HFA;VENTOLIN HFA) 108 (90 BASE) MCG/ACT inhaler Inhale 2 puffs into the lungs every 6 (six) hours as needed. For shortness of breath      . amLODipine-benazepril (LOTREL) 5-10 MG per capsule Take 1 capsule by mouth every morning.      . beclomethasone (QVAR) 40 MCG/ACT inhaler Inhale 2 puffs into the lungs daily as needed. For shortness of breath      . clonazePAM (KLONOPIN) 2 MG tablet Take 2 mg by  mouth 2 (two) times daily.      . diclofenac sodium (VOLTAREN) 1 % GEL Apply 1 application topically 2 (two) times daily.       Marland Kitchen omeprazole (PRILOSEC) 20 MG capsule Take 20 mg by mouth 2 (two) times daily.      Marland Kitchen oxyCODONE-acetaminophen (PERCOCET) 10-325 MG per tablet Take 1 tablet by mouth 4 (four) times daily.      . promethazine (PHENERGAN) 25 MG tablet Take 25 mg by mouth every 6 (six) hours as needed. For nausea      . simvastatin (ZOCOR) 40 MG tablet Take 40 mg by mouth every evening.        No results found for this or any previous visit (from the past 48 hour(s)). No results found.  Review of Systems  All other systems reviewed and are negative.    There were no vitals taken for this visit. Physical Exam  On examination patient has range of motion from 10-100. Collaterals and cruciates are stable. Radiographs show tricompartmental arthritis. Assessment/Plan Assessment: Osteoarthritis right knee.  Plan: We'll plan for total knee arthroplasty risks and benefits were discussed including infection neurovascular injury persistent pain DVT pulmonary embolus need for additional surgery. Patient states she understands  and wishes to proceed at this time.  Dana Gilmore V 12/09/2011, 6:56 AM

## 2011-12-09 NOTE — Anesthesia Procedure Notes (Addendum)
Procedure Name: Intubation Date/Time: 12/09/2011 8:34 AM Performed by: MCPHAIL, NANCY S Pre-anesthesia Checklist: Patient identified, Emergency Drugs available, Timeout performed, Suction available and Patient being monitored Patient Re-evaluated:Patient Re-evaluated prior to inductionOxygen Delivery Method: Circle system utilized Preoxygenation: Pre-oxygenation with 100% oxygen Intubation Type: IV induction Ventilation: Mask ventilation without difficulty Laryngoscope Size: Mac and 3 Grade View: Grade II Tube type: Oral Tube size: 7.5 mm Number of attempts: 1 Airway Equipment and Method: Stylet Placement Confirmation: ETT inserted through vocal cords under direct vision,  positive ETCO2 and breath sounds checked- equal and bilateral Secured at: 22 cm Tube secured with: Tape Dental Injury: Teeth and Oropharynx as per pre-operative assessment    Anesthesia Regional Block:  Femoral nerve block  Pre-Anesthetic Checklist: ,, timeout performed, Correct Patient, Correct Site, Correct Laterality, Correct Procedure, Correct Position, site marked, Risks and benefits discussed,  Surgical consent,  Pre-op evaluation,  At surgeon's request and post-op pain management  Laterality: Right and Lower  Prep: chloraprep       Needles:  Injection technique: Single-shot  Needle Type: Echogenic Needle     Needle Length: 9cm  Needle Gauge: 22 and 22 G    Additional Needles:  Procedures: ultrasound guided Femoral nerve block Narrative:  Start time: 12/09/2011 8:06 AM End time: 12/09/2011 8:16 AM Injection made incrementally with aspirations every 5 mL.  Performed by: Personally  Anesthesiologist: Sheldon Silvan, MD  Additional Notes: Maraine 0.5% with EPI 1:200000  25 ml  Femoral nerve block

## 2011-12-09 NOTE — Plan of Care (Signed)
Problem: Consults Goal: Diagnosis- Total Joint Replacement Outcome: Completed/Met Date Met:  12/09/11 Primary Total Knee

## 2011-12-09 NOTE — Progress Notes (Signed)
UR COMPLETED  

## 2011-12-09 NOTE — Op Note (Signed)
OPERATIVE REPORT  DATE OF SURGERY: 12/09/2011  PATIENT:  Dana Gilmore,  56 y.o. female  PRE-OPERATIVE DIAGNOSIS:  Osteoarthritis Right Knee  POST-OPERATIVE DIAGNOSIS:  Osteoarthritis Right Knee  PROCEDURE:  Procedure(s): TOTAL KNEE ARTHROPLASTY Zimmer components. Size E. femur. Size 4 tibia. 17 mm polyethylene. 29 mm patella.  SURGEON:  Surgeon(s): Nadara Mustard, MD  ANESTHESIA:   regional and general  EBL:  Minimal ML  SPECIMEN:  No Specimen  TOURNIQUET:   Total Tourniquet Time Documented: Thigh (Right) - 27 minutes  PROCEDURE DETAILS: Patient is a 56 year old woman with osteoarthritis of her right knee. She has failed conservative treatment and presents at this time for total knee arthroplasty. Risks and benefits were discussed including infection neurovascular injury persistent pain DVT pulmonary embolus need for additional surgery. Patient states she understands and wishes to proceed at this time. Description of procedure patient was brought to OR room 10 and after a femoral block she then underwent general anesthetic. After adequate levels of anesthesia were obtained patient's right lower extremity was prepped using DuraPrep draped into a sterile field an [place] was used to cover all exposed skin. A midline incision was made carried down through a medial parapatellar retinacular incision IM guide was used for the femur and 12 mm was taken off the distal femur deep to significant loss of the bone from the lateral femoral condyle. Attention was then focused on the tibia 10 mm was taken off the least involved aspect of the tibia with neutral varus and valgus and a 7 posterior slope. This was sized for a size 4 and the keel punch was made for the size 4. Attention was then focused on the femur the femur sized for a size E. in the chamfer cuts and box cuts were made and the trial femoral component was placed this was tried with the tibial components M.D. 17 mm polyethylene had the best  stability and range of motion. The patella was resurfaced and 9 mm was taken off the patella. This sized for a 29 size patellar button. The wounds were irrigated with pulsatile lavage meniscus and loose bodies were removed. The tibial and femoral components were cemented in place in the polyethylene tibial tray was placed the patella was clamped and cemented in place and the knee was left in extension until the cement hardened. All loose cement was removed the knee was again irrigated with pulsatile lavage. The clamp was removed the knee was placed through full range of motion and was stable. The screw was placed to stabilize the polyethylene tray the tourniquet was deflated hemostasis was obtained the retinaculum was closed using #1 Vicryl subcutaneous was closed using 0 Vicryl the skin was closed using staples. The wound was covered with Adaptic orthopedic sponges ABDs dressing Kerlix and Coban. Patient was extubated taken to the PACU in stable condition.  PLAN OF CARE: Admit to inpatient   PATIENT DISPOSITION:  PACU - hemodynamically stable.   Nadara Mustard, MD 12/09/2011 10:39 AM

## 2011-12-09 NOTE — Transfer of Care (Signed)
Immediate Anesthesia Transfer of Care Note 1 Patient: Dana Gilmore  Procedure(s) Performed: Procedure(s) (LRB): TOTAL KNEE ARTHROPLASTY (Right)  Patient Location: PACU  Anesthesia Type: General  Level of Consciousness: awake, alert  and oriented  Airway & Oxygen Therapy: Patient Spontanous Breathing and Patient connected to nasal cannula oxygen  Post-op Assessment: Report given to PACU RN and Post -op Vital signs reviewed and stable  Post vital signs: Reviewed and stable  Complications: No apparent anesthesia complications

## 2011-12-09 NOTE — Preoperative (Signed)
Beta Blockers   Reason not to administer Beta Blockers:Not Applicable 

## 2011-12-09 NOTE — Anesthesia Preprocedure Evaluation (Addendum)
Anesthesia Evaluation  Patient identified by MRN, date of birth, ID band Patient awake    Reviewed: Allergy & Precautions, H&P , NPO status , Patient's Chart, lab work & pertinent test results  History of Anesthesia Complications (+) POST - OP SPINAL HEADACHE  Airway Mallampati: I TM Distance: >3 FB Neck ROM: Full    Dental  (+) Teeth Intact and Dental Advisory Given   Pulmonary shortness of breath, Current Smoker,  breath sounds clear to auscultation        Cardiovascular hypertension, Pt. on medications Rhythm:Regular Rate:Normal     Neuro/Psych  Headaches, Anxiety Depression    GI/Hepatic Neg liver ROS, GERD-  ,  Endo/Other  negative endocrine ROS  Renal/GU negative Renal ROS  negative genitourinary   Musculoskeletal  (+) Arthritis -, Osteoarthritis,    Abdominal   Peds  Hematology negative hematology ROS (+)   Anesthesia Other Findings   Reproductive/Obstetrics negative OB ROS                         Anesthesia Physical Anesthesia Plan  ASA: III  Anesthesia Plan: General   Post-op Pain Management:    Induction: Intravenous  Airway Management Planned: Oral ETT  Additional Equipment:   Intra-op Plan:   Post-operative Plan: Extubation in OR  Informed Consent: I have reviewed the patients History and Physical, chart, labs and discussed the procedure including the risks, benefits and alternatives for the proposed anesthesia with the patient or authorized representative who has indicated his/her understanding and acceptance.   Dental advisory given  Plan Discussed with: CRNA, Anesthesiologist and Surgeon  Anesthesia Plan Comments:         Anesthesia Quick Evaluation

## 2011-12-10 ENCOUNTER — Encounter (HOSPITAL_COMMUNITY): Payer: Self-pay | Admitting: Orthopedic Surgery

## 2011-12-10 LAB — CBC
Hemoglobin: 9.7 g/dL — ABNORMAL LOW (ref 12.0–15.0)
RBC: 3.4 MIL/uL — ABNORMAL LOW (ref 3.87–5.11)

## 2011-12-10 LAB — BASIC METABOLIC PANEL
GFR calc Af Amer: 84 mL/min — ABNORMAL LOW (ref >90)
GFR calc non Af Amer: 73 mL/min — ABNORMAL LOW (ref >90)
Potassium: 3.7 mEq/L (ref 3.5–5.1)
Sodium: 137 mEq/L (ref 135–145)

## 2011-12-10 LAB — PROTIME-INR: Prothrombin Time: 15.5 seconds — ABNORMAL HIGH (ref 11.6–15.2)

## 2011-12-10 MED ORDER — PNEUMOCOCCAL VAC POLYVALENT 25 MCG/0.5ML IJ INJ
0.5000 mL | INJECTION | INTRAMUSCULAR | Status: AC
  Administered 2011-12-11: 0.5 mL via INTRAMUSCULAR
  Filled 2011-12-10: qty 0.5

## 2011-12-10 MED ORDER — WARFARIN SODIUM 7.5 MG PO TABS
7.5000 mg | ORAL_TABLET | Freq: Once | ORAL | Status: AC
  Administered 2011-12-10: 7.5 mg via ORAL
  Filled 2011-12-10: qty 1

## 2011-12-10 NOTE — Evaluation (Signed)
Occupational Therapy Evaluation Patient Details Name: Dana Gilmore MRN: [id number] DOB: 07/11/1956 Today's Date: 12/10/2011 Time: 8295-6213 OT Time Calculation (min): 31 min  OT Assessment / Plan / Recommendation Clinical Impression  This 56 yo s/p RTKA presents to acute OT with problems below. Will benefit from acute OT without need for further therapy post acute.    OT Assessment  Patient needs continued OT Services    Follow Up Recommendations  No OT follow up    Equipment Recommendations  3 in 1 bedside comode;Tub/shower bench    Frequency Min 2X/week    Precautions / Restrictions Precautions Precautions: Knee Precaution Booklet Issued: No Restrictions Weight Bearing Restrictions: Yes RLE Weight Bearing: Weight bearing as tolerated       ADL  Eating/Feeding: Simulated;Independent Where Assessed - Eating/Feeding: Edge of bed Grooming: Performed;Wash/dry hands;Supervision/safety Where Assessed - Grooming: Standing at sink Upper Body Bathing: Simulated;Set up Where Assessed - Upper Body Bathing: Unsupported;Sitting, bed Lower Body Bathing: Simulated;Moderate assistance Where Assessed - Lower Body Bathing: Unsupported;Sit to stand from bed Upper Body Dressing: Simulated;Set up Where Assessed - Upper Body Dressing: Unsupported;Sitting, bed Lower Body Dressing: Simulated;Moderate assistance Where Assessed - Lower Body Dressing: Unsupported;Sit to stand from bed Toilet Transfer: Performed;Supervision/safety Toilet Transfer Method: Proofreader: Raised toilet seat with arms (or 3-in-1 over toilet) Toileting - Clothing Manipulation: Performed;Independent (gown) Where Assessed - Toileting Clothing Manipulation: Standing Toileting - Hygiene: Performed;Independent Where Assessed - Toileting Hygiene: Sit to stand from 3-in-1 or toilet Tub/Shower Transfer: Not assessed Tub/Shower Transfer Method: Not assessed Equipment Used: Rolling walker  (3-n-1) Ambulation Related to ADLs: Min guard A ADL Comments: Says significant other can A her with socks/shoes until she can do them by herself    OT Goals Acute Rehab OT Goals OT Goal Formulation: With patient Time For Goal Achievement: 12/11/11 Potential to Achieve Goals: Good ADL Goals Pt Will Perform Tub/Shower Transfer: Tub transfer;with supervision;Ambulation;with DME;Transfer tub bench ADL Goal: Tub/Shower Transfer - Progress: Goal set today  Visit Information  Last OT Received On: 12/10/11 Assistance Needed: +1    Subjective Data  Subjective: I told Dr. Lajoyce Corners that this hurst really bad and I wished I had not had it done, but I know at least from what people have told me that I will be happy I had it done after the pain goes away Patient Stated Goal: Look into equipment for the tub   Prior Functioning  Home Living Lives With: Significant other Available Help at Discharge: Friend(s) (and significant other) Type of Home: Mobile home Home Access: Stairs to enter [employer] of Steps: 5 Entrance Stairs-Rails: Can reach both Home Layout: One level Bathroom Shower/Tub: Forensic scientist: Standard Bathroom Accessibility: Yes How Accessible: Accessible via walker Home Adaptive Equipment: Shower chair with back;Raised toilet seat with rails;Walker - standard Prior Function Level of Independence: Independent with assistive device(s) (tub seat with back) Able to Take Stairs?: Yes Driving: No Vocation: On disability Communication Communication: No difficulties Dominant Hand: Right    Cognition  Overall Cognitive Status: Appears within functional limits for tasks assessed/performed Arousal/Alertness: Awake/alert Orientation Level: Appears intact for tasks assessed Behavior During Session: [facility] for tasks performed    Extremity/Trunk Assessment Right Upper Extremity Assessment RUE ROM/Strength/Tone: Within functional levels Left Upper  Extremity Assessment LUE ROM/Strength/Tone: Within functional levels   Mobility Bed Mobility Bed Mobility: Supine to Sit;Sitting - Scoot to Edge of Bed Supine to Sit: 5: Supervision;With rails;HOB elevated (30 degrees) Sitting - Scoot to Edge of Bed:  6: Modified independent (Device/Increase time);With rail Sit to Supine: 4: Min assist Details for Bed Mobility Assistance: minA to elevate leg to bed but pt able to independently maneuver with with posterior scoot on a flat bed to reposition Transfers Transfers: Sit to Stand;Stand to Sit Sit to Stand: 5: Supervision;With upper extremity assist;From bed Stand to Sit: 5: Supervision;With upper extremity assist;With armrests;To chair/3-in-1 Details for Transfer Assistance: VC's to put LE out in front before sit to stand and stand to sit   Exercise Total Joint Exercises Ankle Circles/Pumps: AROM;Both;10 reps;Seated Quad Sets: AROM;Right;10 reps;Seated Hip ABduction/ADduction: AAROM;10 reps;Right;Supine Knee Flexion: AAROM;Seated;Right (x1) Goniometric ROM: did not measure but grossly appeared 60 degrees in seated position; educated pt on AA knee flexion sitting  Balance    End of Session OT - End of Session Activity Tolerance: Patient tolerated treatment well Patient left: in chair;with call bell/phone within reach   Evette Georges [phone] 12/10/2011, 3:55 PM

## 2011-12-10 NOTE — Progress Notes (Signed)
Physical Therapy Treatment Patient Details Name: Dana Gilmore MRN: [id number] DOB: 1956-07-24 Today's Date: 12/10/2011 Time: 1478-2956 PT Time Calculation (min): 39 min  PT Assessment / Plan / Recommendation Comments on Treatment Session       Follow Up Recommendations  Home health PT    Equipment Recommendations  None recommended by PT    Frequency 7X/week   Plan      Precautions / Restrictions Precautions Precautions: Knee Precaution Booklet Issued: No Restrictions Weight Bearing Restrictions: Yes RLE Weight Bearing: Weight bearing as tolerated   Pertinent Vitals/Pain 6/10 at R knee. Premedicated by nurse.     Mobility  Bed Mobility Bed Mobility: Supine to Sit Supine to Sit: 4: Min assist Details for Bed Mobility Assistance: Assist with lifting R LE off to the side of the bed. Cueing for R LE placement Transfers Transfers: Sit to Stand;Stand to Sit (Trials x 2) Sit to Stand: 4: Min assist;With upper extremity assist;From bed;From chair/3-in-1;With armrests Stand to Sit: 4: Min assist;With upper extremity assist;With armrests;To chair/3-in-1 Details for Transfer Assistance: Assist for balance. Cueing for sequencing in hand placement and making sure pt is close enough to the seat.  Ambulation/Gait Ambulation/Gait Assistance: 4: Min assist Ambulation Distance (Feet): 20 Feet (10 feet x 2) Assistive device: Rolling walker Ambulation/Gait Assistance Details: Assist for balance. Cueing for pushing through both UE and sequencing of rolling walker, then step. Gait Pattern: Step-to pattern;Decreased step length - right;Decreased stance time - right Stairs: No Wheelchair Mobility Wheelchair Mobility: No    Exercises Total Joint Exercises Ankle Circles/Pumps: AROM;10 reps;Supine;Right (Pt reported R Ankle fusion - able to move toes.) Quad Sets: AROM;10 reps;Supine;Right Heel Slides: AROM;10 reps;Supine;Right   PT Goals Acute Rehab PT Goals PT Goal Formulation: With  patient Time For Goal Achievement: 12/17/11 Potential to Achieve Goals: Good Pt will go Supine/Side to Sit: with modified independence PT Goal: Supine/Side to Sit - Progress: Goal set today Pt will go Sit to Supine/Side: with modified independence PT Goal: Sit to Supine/Side - Progress: Goal set today Pt will go Sit to Stand: with modified independence PT Goal: Sit to Stand - Progress: Goal set today Pt will go Stand to Sit: with modified independence PT Goal: Stand to Sit - Progress: Goal set today Pt will Ambulate: >150 feet;with modified independence;with rolling walker PT Goal: Ambulate - Progress: Goal set today Pt will Go Up / Down Stairs: 3-5 stairs;with modified independence;with rolling walker PT Goal: Up/Down Stairs - Progress: Goal set today Pt will Perform Home Exercise Program: Independently PT Goal: Perform Home Exercise Program - Progress: Goal set today  Visit Information  Last PT Received On: 12/10/11 Assistance Needed: +1    Subjective Data  Subjective: "My knee really hurts." Patient Stated Goal: Go home.    Cognition  Overall Cognitive Status: Appears within functional limits for tasks assessed/performed Arousal/Alertness: Awake/alert Orientation Level: Appears intact for tasks assessed Behavior During Session: [facility] for tasks performed    Balance  Balance Balance Assessed: No  End of Session PT - End of Session Equipment Utilized During Treatment: Gait belt Activity Tolerance: Patient tolerated treatment well;Patient limited by pain;Patient limited by fatigue Patient left: in chair;with call bell/phone within reach Nurse Communication: Mobility status    Oretha Ellis 12/10/2011, 12:26 PM

## 2011-12-10 NOTE — Progress Notes (Signed)
Subjective: 1 Day Post-Op Procedure(s) (LRB): TOTAL KNEE ARTHROPLASTY (Right) Patient reports pain as moderate.    Objective: Vital signs in last 24 hours: Temp:  [96.9 F (36.1 C)-99.3 F (37.4 C)] 98 F (36.7 C) (05/02 0222) Pulse Rate:  [47-87] 85  (05/02 0222) Resp:  [9-19] 18  (05/02 0222) BP: (107-131)/(53-75) 107/64 mmHg (05/02 0222) SpO2:  [96 %-100 %] 97 % (05/02 0222) Weight:  [103.42 kg (228 lb)] 103.42 kg (228 lb) (05/01 1429)  Intake/Output from previous day: 05/01 0701 - 05/02 0700 In: 2890.3 [P.O.:222; I.V.:2468.3] Out: 1375 [Urine:1175; Blood:200] Intake/Output this shift: Total I/O In: 218.3 [I.V.:218.3] Out: 1050 [Urine:1050]   Basename 12/10/11 0555  HGB 9.7*    Basename 12/10/11 0555  WBC 6.5  RBC 3.40*  HCT 29.9*  PLT 168   No results found for this basename: NA:2,K:2,CL:2,CO2:2,BUN:2,CREATININE:2,GLUCOSE:2,CALCIUM:2 in the last 72 hours No results found for this basename: LABPT:2,INR:2 in the last 72 hours  Neurologically intact  Assessment/Plan: 1 Day Post-Op Procedure(s) (LRB): TOTAL KNEE ARTHROPLASTY (Right) Up with therapy Discharge home with home health  Dana Gilmore V 12/10/2011, 6:13 AM

## 2011-12-10 NOTE — Progress Notes (Signed)
Physical Therapy Treatment Patient Details Name: Dana Gilmore MRN: [id number] DOB: [birth year] Today's Date: 12/10/2011 Time: 4098-1191 PT Time Calculation (min): 30 min  PT Assessment / Plan / Recommendation Comments on Treatment Session  Doing great. Fatigued because she has been up a lot today. Ambulating to the bathroom with nursing staff. Added ABD/ADD to HEP and encouraged quad sets and APs for practice later.     Follow Up Recommendations  Home health PT    Equipment Recommendations  None recommended by PT    Frequency     Plan Discharge plan remains appropriate;Frequency remains appropriate    Precautions / Restrictions Precautions Precautions: Knee Restrictions Weight Bearing Restrictions: Yes RLE Weight Bearing: Weight bearing as tolerated       Mobility  Bed Mobility Bed Mobility: Sit to Supine Sit to Supine: 4: Min assist Details for Bed Mobility Assistance: minA to elevate leg to bed but pt able to independently maneuver with with posterior scoot on a flat bed to reposition Transfers Sit to Stand: 3: Mod assist;4: Min assist;With upper extremity assist;From chair/3-in-1;With armrests Stand to Sit: 4: Min assist;With armrests;To chair/3-in-1;To bed Details for Transfer Assistance: modA facilitation to stand from recliner and to sequence/stabilize with RW for upright standing; easier to stand  and sit to and from 3in1 over commode with min tactile and v/c's to sequence Ambulation/Gait Ambulation/Gait Assistance: 4: Min assist Ambulation Distance (Feet): 20 Feet Assistive device: Rolling walker Ambulation/Gait Assistance Details: slower step to gait; encouraged increased WB as she could tolerate; cues cue to maneuver RW especially in bathroom Gait Pattern: Step-to pattern;Antalgic;Trunk flexed    Exercises Total Joint Exercises Ankle Circles/Pumps: AROM;Both;10 reps;Seated Quad Sets: AROM;Right;10 reps;Seated Hip ABduction/ADduction: AAROM;10  reps;Right;Supine Knee Flexion: AAROM;Seated;Right (x1) Goniometric ROM: did not measure but grossly appeared 60 degrees in seated position; educated pt on AA knee flexion sitting   PT Goals Acute Rehab PT Goals PT Goal: Sit to Supine/Side - Progress: Progressing toward goal PT Goal: Sit to Stand - Progress: Progressing toward goal PT Goal: Stand to Sit - Progress: Progressing toward goal PT Goal: Ambulate - Progress: Progressing toward goal PT Goal: Perform Home Exercise Program - Progress: Progressing toward goal  Visit Information  Last PT Received On: 12/10/11 Assistance Needed: +1    Subjective Data  Subjective: I have a busy day.    Cognition  Overall Cognitive Status: Appears within functional limits for tasks assessed/performed Arousal/Alertness: Awake/alert Orientation Level: Appears intact for tasks assessed Behavior During Session: [facility] for tasks performed    Balance     End of Session PT - End of Session Equipment Utilized During Treatment: Gait belt Activity Tolerance: Patient tolerated treatment well;Patient limited by pain;Patient limited by fatigue Patient left: in bed;with call bell/phone within reach;with bed alarm set Nurse Communication: Mobility status    WHITLOW,Sonnia Strong HELEN 12/10/2011, 3:30 PM

## 2011-12-10 NOTE — Progress Notes (Signed)
Agree with evaluation and goals.  12/10/2011 Cephus Shelling, PT, DPT [phone]

## 2011-12-11 LAB — BASIC METABOLIC PANEL
Chloride: 102 mEq/L (ref 96–112)
GFR calc Af Amer: >90 mL/min (ref >90)
Potassium: 3.5 mEq/L (ref 3.5–5.1)

## 2011-12-11 LAB — CBC
HCT: 27.8 % — ABNORMAL LOW (ref 36.0–46.0)
Hemoglobin: 9 g/dL — ABNORMAL LOW (ref 12.0–15.0)
RDW: 14.3 % (ref 11.5–15.5)
WBC: 6.3 10*3/uL (ref 4.0–10.5)

## 2011-12-11 LAB — PROTIME-INR
INR: 2.93 — ABNORMAL HIGH (ref 0.00–1.49)
Prothrombin Time: 31 seconds — ABNORMAL HIGH (ref 11.6–15.2)

## 2011-12-11 MED ORDER — HYDROCODONE-ACETAMINOPHEN 5-500 MG PO TABS: 1.0000 | ORAL_TABLET | Freq: Four times a day (QID) | ORAL | Status: AC | PRN

## 2011-12-11 MED ORDER — OXYCODONE-ACETAMINOPHEN 5-325 MG PO TABS: 1.0000 | ORAL_TABLET | ORAL | Status: AC | PRN

## 2011-12-11 MED ORDER — WARFARIN SODIUM 1 MG PO TABS: 1.0000 mg | ORAL_TABLET | Freq: Every day | ORAL | Status: DC

## 2011-12-11 MED ORDER — MAGNESIUM HYDROXIDE 400 MG/5ML PO SUSP
30.0000 mL | Freq: Every day | ORAL | Status: DC | PRN
  Administered 2011-12-11: 30 mL via ORAL
  Filled 2011-12-11: qty 30

## 2011-12-11 NOTE — Consult Note (Signed)
ANTICOAGULATION CONSULT NOTE - Follow Up Consult  Pharmacy Consult for : Coumadin Indication: VTE prophylaxis  Allergies  Allergen Reactions  . Ibuprofen Other (See Comments)    Hypertension; "medical dr tells me do not take it"  . Neurontin (Gabapentin) Nausea And Vomiting  . Pregabalin Hives and Itching  . Tylenol (Acetaminophen)     No other than what is in her medication    Patient Measurements: Height: 6\' 1"  (185.4 cm) Weight: 228 lb (103.42 kg) IBW/kg (Calculated) : 75.4    Vital Signs: Temp: 98.6 F (37 C) (05/03 0552) BP: 107/62 mmHg (05/03 0552) Pulse Rate: 81  (05/03 0552)  Labs:  Basename 12/11/11 0655 12/10/11 0555  HGB 9.0* 9.7*  HCT 27.8* 29.9*  PLT 163 168  APTT -- --  LABPROT 31.0* 15.5*  INR 2.93* 1.20  HEPARINUNFRC -- --  CREATININE 0.72 0.88  CKTOTAL -- --  CKMB -- --  TROPONINI -- --   Estimated Creatinine Clearance: 108.6 ml/min (by C-G formula based on Cr of 0.72).   Medications:     amLODipine 5 mg Daily  atorvastatin 20 mg q1800  benazepril 10 mg Daily  clonazePAM 2 mg BID  fluticasone 1 puff BID  pantoprazole 40 mg Q1200  pneumococcal 23 valent vaccine 0.5 mL Tomorrow-1000  warfarin 7.5 mg ONCE-1800  warfarin 1 each Once  Warfarin - Pharmacist Dosing Inpatient  q1800    Assessment:  56 y/o white female Day # 2 post op R-TKA on Coumadin for VTE prophylaxis.  Large jump in INR over night after only 2 doses of Coumadin.    INR  1.2 >> 2.93.  No complications noted.  No Drug Interactions to account for large INR response.  Goal of Therapy:   INR 2-3   Plan:   No Coumadin today.  Daily INR's, CBC.  Dana Gilmore, Elisha Headland, Pharm.D. 12/11/2011 11:41 AM

## 2011-12-11 NOTE — Progress Notes (Signed)
Physical Therapy Treatment Patient Details Name: Dana Gilmore MRN: [id number] DOB: [birth year] Today's Date: 12/11/2011 Time: 1401-1430 PT Time Calculation (min): 29 min  PT Assessment / Plan / Recommendation Comments on Treatment Session  Pt admitted s/p R TKA. Attempted stairs this PM and will practice again next AM. Pt would benefit from continued PT services to facilitate D/C home for HHPT.     Follow Up Recommendations  Home health PT    Equipment Recommendations  None recommended by PT    Frequency 7X/week   Plan Discharge plan remains appropriate;Frequency remains appropriate    Precautions / Restrictions Precautions Precautions: Knee Precaution Booklet Issued: No Restrictions Weight Bearing Restrictions: Yes RLE Weight Bearing: Weight bearing as tolerated   Pertinent Vitals/Pain 5/10 pain at R knee. RN aware. Left with ice pack on R knee.     Mobility  Bed Mobility Bed Mobility: Sit to Supine Supine to Sit: 4: Min assist Sit to Supine: 4: Min guard Details for Bed Mobility Assistance: Guard for balance. Cues for R LE placement.  Transfers Transfers: Sit to Stand;Stand to Sit (Trials x 2) Sit to Stand: 4: Min guard;With upper extremity assist;With armrests;From chair/3-in-1 Stand to Sit: 4: Min guard;With upper extremity assist;To bed;To chair/3-in-1 Details for Transfer Assistance: Guard for balance. Cueing for tall posture.  Ambulation/Gait Ambulation/Gait Assistance: 4: Min guard Ambulation Distance (Feet): 80 Feet Assistive device: Rolling walker Ambulation/Gait Assistance Details: Guard for balance. Cueing for walker placement and tall posture.  Gait Pattern: Step-to pattern;Decreased step length - right;Decreased stance time - right Stairs: Yes Stairs Assistance: 4: Min assist Stairs Assistance Details (indicate cue type and reason): Assist with stepping up. Cueing for tall posture and "up with good, down with bad." Stair Management Technique: Two rails;Step  to pattern;Forwards Number of Stairs: 2  Wheelchair Mobility Wheelchair Mobility: No    Exercises Total Joint Exercises Ankle Circles/Pumps: AROM;Both;10 reps;Seated Quad Sets: AROM;Right;10 reps;Seated Short Arc Quad: AROM;Right;10 reps;Seated Heel Slides: AROM;10 reps;Right;Seated Hip ABduction/ADduction: AAROM;10 reps;Right;Seated Straight Leg Raises: AROM;10 reps;Right;Seated Knee Flexion: AAROM;Seated;Right Goniometric ROM: 0-60 degrees right knee.   PT Goals Acute Rehab PT Goals PT Goal Formulation: With patient Time For Goal Achievement: 12/17/11 Potential to Achieve Goals: Good PT Goal: Supine/Side to Sit - Progress: Progressing toward goal PT Goal: Sit to Supine/Side - Progress: Progressing toward goal PT Goal: Sit to Stand - Progress: Progressing toward goal PT Goal: Stand to Sit - Progress: Progressing toward goal PT Goal: Ambulate - Progress: Progressing toward goal PT Goal: Up/Down Stairs - Progress: Progressing toward goal PT Goal: Perform Home Exercise Program - Progress: Progressing toward goal  Visit Information  Last PT Received On: 12/11/11 Assistance Needed: +1    Subjective Data  Subjective: "I'm a little more sore now." Patient Stated Goal: Go home.    Cognition  Overall Cognitive Status: Appears within functional limits for tasks assessed/performed Arousal/Alertness: Awake/alert Orientation Level: Appears intact for tasks assessed Behavior During Session: [facility] for tasks performed    Balance  Balance Balance Assessed: No  End of Session PT - End of Session Equipment Utilized During Treatment: Gait belt Activity Tolerance: Patient tolerated treatment well;Patient limited by pain;Patient limited by fatigue Patient left: in bed;with call bell/phone within reach Nurse Communication: Mobility status    Oretha Ellis 12/11/2011, 2:42 PM

## 2011-12-11 NOTE — Progress Notes (Signed)
Physical Therapy Treatment Patient Details Name: Dana Gilmore MRN: [id number] DOB: 04/02/1956 Today's Date: 12/11/2011 Time: 0347-4259 PT Time Calculation (min): 40 min  PT Assessment / Plan / Recommendation Comments on Treatment Session  Pt admitted s/p R TKA. Much improved on sit to stand and ambulated greater distance. Will attempt stairs this PM. Pt would benefit from continued PT services to facilitate D/C home for HHPT.     Follow Up Recommendations  Home health PT    Equipment Recommendations  None recommended by PT    Frequency 7X/week   Plan Discharge plan remains appropriate;Frequency remains appropriate    Precautions / Restrictions Precautions Precautions: Knee Precaution Booklet Issued: No Restrictions Weight Bearing Restrictions: Yes RLE Weight Bearing: Weight bearing as tolerated   Pertinent Vitals/Pain 2/10 at R knee. Premedicated by RN.     Mobility  Bed Mobility Bed Mobility: Supine to Sit Supine to Sit: 4: Min assist Details for Bed Mobility Assistance: Assist with elevating R LE. Cueing for hooking L LE under R LE to ease in transition off the bed.  Transfers Transfers: Sit to Stand;Stand to Sit (Trials x 2) Sit to Stand: 4: Min guard;With upper extremity assist;With armrests;From bed;From chair/3-in-1 Stand to Sit: 4: Min guard;With upper extremity assist;With armrests;To chair/3-in-1 Details for Transfer Assistance: Guard for balance. Cueing to put R LE out in front when sitting and to slowly lower using arms and L LE. Ambulation/Gait Ambulation/Gait Assistance: 4: Min guard Ambulation Distance (Feet): 60 Feet Assistive device: Rolling walker Ambulation/Gait Assistance Details: Guard for balance. Cueing needed for walker and both LE sequencing and placement.  Gait Pattern: Step-to pattern;Decreased step length - right;Decreased stance time - right Stairs: No Wheelchair Mobility Wheelchair Mobility: No    Exercises Total Joint Exercises Ankle  Circles/Pumps: AROM;Both;10 reps;Seated Quad Sets: AROM;Right;10 reps;Seated Short Arc Quad: AROM;Right;10 reps;Seated Heel Slides: AROM;10 reps;Right;Seated Hip ABduction/ADduction: AAROM;10 reps;Right;Seated Straight Leg Raises: AROM;10 reps;Right;Seated Knee Flexion: AAROM;Seated;Right Goniometric ROM: 0-60 degrees right knee.   PT Goals Acute Rehab PT Goals PT Goal Formulation: With patient Time For Goal Achievement: 12/17/11 Potential to Achieve Goals: Good PT Goal: Supine/Side to Sit - Progress: Progressing toward goal PT Goal: Sit to Stand - Progress: Progressing toward goal PT Goal: Stand to Sit - Progress: Progressing toward goal PT Goal: Ambulate - Progress: Progressing toward goal PT Goal: Perform Home Exercise Program - Progress: Progressing toward goal  Visit Information  Last PT Received On: 12/11/11 Assistance Needed: +1    Subjective Data  Subjective: "I'm having a much better day." Patient Stated Goal: Go home.    Cognition  Overall Cognitive Status: Appears within functional limits for tasks assessed/performed Arousal/Alertness: Awake/alert Orientation Level: Appears intact for tasks assessed Behavior During Session: [facility] for tasks performed    Balance  Balance Balance Assessed: No  End of Session PT - End of Session Equipment Utilized During Treatment: Gait belt Activity Tolerance: Patient tolerated treatment well;Patient limited by pain;Patient limited by fatigue Patient left: in chair;with call bell/phone within reach Nurse Communication: Mobility status    Oretha Ellis 12/11/2011, 12:05 PM

## 2011-12-11 NOTE — Progress Notes (Signed)
Patient ID: Dana Gilmore, female   DOB: 1955/09/29, 56 y.o.   MRN: [id number] Postoperative day 2 right total knee arthroplasty. Plan for discharge to home on Saturday. Prescriptions on the chart for discharge. Home health physical therapy and durable medical equipment followup in the office in 2 weeks. Change dressing daily.

## 2011-12-11 NOTE — Progress Notes (Signed)
Agree with treatment.  12/11/2011 Cephus Shelling, PT, DPT [phone]

## 2011-12-11 NOTE — Progress Notes (Signed)
Agree with treatment session.  12/11/2011 Keta [place] M Anandi Abramo, PT, DPT [phone]   

## 2011-12-11 NOTE — Discharge Instructions (Signed)
Home Health to be provided by [facility] [phone] 

## 2011-12-11 NOTE — Progress Notes (Signed)
CARE MANAGEMENT NOTE 12/11/2011  Patient:  Dana Gilmore, Dana Gilmore   Account Number:  [id number]  Date Initiated:  12/11/2011  Documentation initiated by:  Vance Peper  Subjective/Objective Assessment:   56 yr old female s/p right total knee arthroplasty     Action/Plan:   Spoke with patient regarding home health needs. Patient preoperatively setup with [facility], no changes. DME has been delivered to the patient's home.   Anticipated DC Date:  12/12/2011   Anticipated DC Plan:  HOME W HOME HEALTH SERVICES      DC Planning Services  CM consult      [facility] Choice  HOME HEALTH   Choice offered to / List presented to:  C-1 Patient        HH arranged  HH-2 PT  HH-1 RN      [facility] agency  [facility]   Status of service:  Completed, signed off Discharge Disposition:  HOME W HOME HEALTH SERVICES

## 2011-12-11 NOTE — Progress Notes (Signed)
Occupational Therapy Treatment Patient Details Name: Dana Gilmore MRN: [id number] DOB: [birth year] Today's Date: 12/11/2011 Time: 1220-1253 OT Time Calculation (min): 33 min  OT Assessment / Plan / Recommendation Comments on Treatment Session      Follow Up Recommendations  No OT follow up    Equipment Recommendations  None recommended by PT    Frequency Min 2X/week   Plan Discharge plan remains appropriate    Precautions / Restrictions Precautions Precautions: Knee Precaution Booklet Issued: No Restrictions Weight Bearing Restrictions: Yes RLE Weight Bearing: Weight bearing as tolerated   Pertinent Vitals/Pain Pt. Medicated prior to start of tx. Session, states she feels fine    ADL  Tub/Shower Transfer: Performed;Supervision/safety;Set up Tub/Shower Transfer Method: Ambulating Tub/Shower Transfer Equipment: Transfer tub bench;Other (comment) (tub with right faucet) Equipment Used: Rolling walker;Gait belt Ambulation Related to ADLs: min guard A    OT Goals ADL Goals ADL Goal: Tub/Shower Transfer - Progress: Met  Visit Information  Last OT Received On: 12/11/11 Assistance Needed: +1    Subjective Data  Subjective: "i got three grand kids i gotta get ready for, i'm not going to let this get me" Patient Stated Goal: to return home and spend time with her grandchildren   Prior Functioning       Cognition  Overall Cognitive Status: Appears within functional limits for tasks assessed/performed Arousal/Alertness: Awake/alert Orientation Level: Appears intact for tasks assessed Behavior During Session: [facility] for tasks performed    Mobility Bed Mobility Bed Mobility: Left Sidelying to Sit;Sitting - Scoot to [place] of Bed;Rolling Right Rolling Right: 4: Min assist Supine to Sit: 4: Min guard Sitting - Scoot to [place] of Bed: 5: Supervision Sit to Supine: 4: Min guard Details for Bed Mobility Assistance: requested demonstration of how to use sheet/towel to assist with  moving (r) LE Transfers Transfers: Sit to Stand;Stand to Sit Sit to Stand: 4: Min guard Stand to Sit: 4: Min guard Details for Transfer Assistance: cues for standing upright as she tends to stay bending forward   Exercises Total Joint Exercises Ankle Circles/Pumps: AROM;Both;10 reps;Seated Quad Sets: AROM;Right;10 reps;Seated Short Arc Quad: AROM;Right;10 reps;Seated Heel Slides: AROM;10 reps;Right;Seated Hip ABduction/ADduction: AAROM;10 reps;Right;Seated Straight Leg Raises: AROM;10 reps;Right;Seated Knee Flexion: AAROM;Seated;Right Goniometric ROM: 0-60 degrees right knee.  Balance Balance Balance Assessed: No  End of Session OT - End of Session Activity Tolerance: Patient tolerated treatment well Patient left: in chair;with call bell/phone within reach   Robet Leu 12/11/2011, 3:02 PM

## 2011-12-11 NOTE — Progress Notes (Signed)
Referral received for SNF. Chart reviewed and CSW has spoken with RNCM who indicates that patient is for DC to home with Home Health and DME.  CSW to sign off. Please re-consult if CSW needs arise.  Prosperity Darrough T. Dylin Breeden, BSW  [phone]  

## 2011-12-12 LAB — BASIC METABOLIC PANEL
CO2: 29 mEq/L (ref 19–32)
Calcium: 8.9 mg/dL (ref 8.4–10.5)
Chloride: 103 mEq/L (ref 96–112)
GFR calc Af Amer: >90 mL/min (ref >90)
Sodium: 139 mEq/L (ref 135–145)

## 2011-12-12 LAB — PROTIME-INR: INR: 3.62 — ABNORMAL HIGH (ref 0.00–1.49)

## 2011-12-12 LAB — CBC
MCV: 88.1 fL (ref 78.0–100.0)
Platelets: 191 10*3/uL (ref 150–400)
RBC: 2.95 MIL/uL — ABNORMAL LOW (ref 3.87–5.11)
WBC: 5.7 10*3/uL (ref 4.0–10.5)

## 2011-12-12 NOTE — Progress Notes (Signed)
Subjective: Pt stable - moving well   Objective: Vital signs in last 24 hours: Temp:  [97.6 F (36.4 C)-98.5 F (36.9 C)] 98.5 F (36.9 C) (05/04 1610) Pulse Rate:  [70-80] 70  (05/04 0632) Resp:  [16-18] 18  (05/04 0632) BP: (103-118)/(58-60) 106/58 mmHg (05/04 0632) SpO2:  [94 %-100 %] 98 % (05/04 0807)  Intake/Output from previous day: 05/03 0701 - 05/04 0700 In: 960 [P.O.:960] Out: 550 [Urine:550] Intake/Output this shift:    Exam:  Neurovascular intact Sensation intact distally Intact pulses distally  Labs:  Basename 12/12/11 0535 12/11/11 0655 12/10/11 0555  HGB 8.9* 9.0* 9.7*    Basename 12/12/11 0535 12/11/11 0655  WBC 5.7 6.3  RBC 2.95* 3.21*  HCT 26.0* 27.8*  PLT 191 163    Basename 12/12/11 0535 12/11/11 0655  NA 139 137  K 4.0 3.5  CL 103 102  CO2 29 28  BUN 10 7  CREATININE 0.73 0.72  GLUCOSE 105* 114*  CALCIUM 8.9 8.8    Basename 12/12/11 0535 12/11/11 0655  LABPT -- --  INR 3.62* 2.93*    Assessment/Plan: Dc today   Kahner Yanik SCOTT 12/12/2011, 8:30 AM

## 2011-12-12 NOTE — Progress Notes (Signed)
PT Treatment Note:   12/12/11 1024  PT Visit Information  Last PT Received On 12/12/11  Assistance Needed +1  PT Time Calculation  PT Start Time 0944  PT Stop Time 0956  PT Time Calculation (min) 12 min  Subjective Data  Subjective "Let's show Dana Gilmore how to do this."  Patient Stated Goal Go home.   Precautions  Precautions Knee  Precaution Booklet Issued No  Restrictions  Weight Bearing Restrictions Yes  RLE Weight Bearing WBAT  Cognition  Overall Cognitive Status Appears within functional limits for tasks assessed/performed  Arousal/Alertness Awake/alert  Orientation Level Appears intact for tasks assessed  Behavior During Session [facility] for tasks performed  Bed Mobility  Bed Mobility Not assessed  Transfers  Transfers Sit to Stand;Stand to Sit  Sit to Stand 6: Modified independent (Device/Increase time)  Stand to Sit 6: Modified independent (Device/Increase time)  Ambulation/Gait  Ambulation/Gait Assistance 5: Supervision  Ambulation Distance (Feet) 20 Feet  Assistive device Rolling walker  Ambulation/Gait Assistance Details Verbal cues for initial contact on right heel and step-through sequence.  Gait Pattern Step-to pattern;Decreased step length - right;Decreased stance time - right  Stairs Yes  Stairs Assistance 5: Supervision  Stairs Assistance Details (indicate cue type and reason) Verbal cues for tall posture with pt able to verbalize sequence.  Pt's caregiver, Dana Gilmore, present for education session.  Stair Management Technique Two rails;Step to pattern;Forwards  Number of Stairs 2   Engineering geologist No  Balance  Balance Assessed No  PT - End of Session  Equipment Utilized During Treatment Gait belt  Activity Tolerance Patient tolerated treatment well  Patient left in chair;with call bell/phone within reach;with family/visitor present  Nurse Communication Mobility status  PT - Assessment/Plan  Comments on Treatment Session Pt admitted s/p  right TKA and continues to progress.  Pt ready for safe d/c home once medically cleared by MD.  Caregiver, Dana Gilmore, present for stair negotiation education per pt request.  PT Plan Discharge plan remains appropriate;Frequency remains appropriate  PT Frequency 7X/week  Follow Up Recommendations Home health PT  Equipment Recommended None recommended by PT  Acute Rehab PT Goals  PT Goal Formulation With patient  Time For Goal Achievement 12/17/11  Potential to Achieve Goals Good  PT Goal: Sit to Stand - Progress Met  PT Goal: Stand to Sit - Progress Met  PT Goal: Ambulate - Progress Progressing toward goal  PT Goal: Up/Down Stairs - Progress Progressing toward goal     12/12/2011 Cephus Shelling, PT, DPT [phone]

## 2011-12-12 NOTE — Progress Notes (Signed)
Physical Therapy Treatment Patient Details Name: Dana Gilmore MRN: [id number] DOB: October 12, 1955 Today's Date: 12/12/2011 Time: 4010-2725 PT Time Calculation (min): 31 min  PT Assessment / Plan / Recommendation Comments on Treatment Session  Pt admitted s/p right TKA and continues to progress.  Pt ready for safe d/c home once medically cleared by MD.    Follow Up Recommendations  Home health PT    Equipment Recommendations  None recommended by PT    Frequency 7X/week   Plan Discharge plan remains appropriate;Frequency remains appropriate    Precautions / Restrictions Precautions Precautions: Knee Precaution Booklet Issued: No Restrictions Weight Bearing Restrictions: Yes RLE Weight Bearing: Weight bearing as tolerated    Pertinent Vitals/Pain 6/10 in right knee.  RN made aware.    Mobility  Bed Mobility Bed Mobility: Supine to Sit Supine to Sit: 6: Modified independent (Device/Increase time);HOB flat Transfers Transfers: Sit to Stand;Stand to Sit (4 trials.) Sit to Stand: 5: Supervision;With upper extremity assist;From bed;From chair/3-in-1 Stand to Sit: 5: Supervision;With upper extremity assist;To chair/3-in-1 Details for Transfer Assistance: Verbal cues for safest hand placement only. Ambulation/Gait Ambulation/Gait Assistance: 5: Supervision Ambulation Distance (Feet): 190 Feet (20 feet x 2 trials, 150 feet.) Assistive device: Rolling walker Ambulation/Gait Assistance Details: Verbal cues for step-through sequence with right heel initial contact. Gait Pattern: Step-to pattern;Decreased step length - right;Decreased stance time - right Stairs: Yes Stairs Assistance: 4: Min guard Stairs Assistance Details (indicate cue type and reason): Guarding for balance with pt able to recall sequence with increased time. Stair Management Technique: Two rails;Step to pattern;Forwards Number of Stairs: 4  Wheelchair Mobility Wheelchair Mobility: No    Exercises Total Joint  Exercises Goniometric ROM: Right knee AROM 0-60 degrees.   PT Goals Acute Rehab PT Goals PT Goal Formulation: With patient Time For Goal Achievement: 12/17/11 Potential to Achieve Goals: Good PT Goal: Supine/Side to Sit - Progress: Met PT Goal: Sit to Stand - Progress: Progressing toward goal PT Goal: Stand to Sit - Progress: Progressing toward goal PT Goal: Ambulate - Progress: Progressing toward goal PT Goal: Up/Down Stairs - Progress: Progressing toward goal PT Goal: Perform Home Exercise Program - Progress: Progressing toward goal  Visit Information  Last PT Received On: 12/12/11 Assistance Needed: +1    Subjective Data  Subjective: "I am ready to get this going." Patient Stated Goal: Go home.    Cognition  Overall Cognitive Status: Appears within functional limits for tasks assessed/performed Arousal/Alertness: Awake/alert Orientation Level: Appears intact for tasks assessed Behavior During Session: [facility] for tasks performed    Balance  Balance Balance Assessed: No  End of Session PT - End of Session Equipment Utilized During Treatment: Gait belt Activity Tolerance: Patient tolerated treatment well Patient left: in chair;with call bell/phone within reach Nurse Communication: Mobility status    Cephus Shelling 12/12/2011, 9:43 AM  12/12/2011 Cephus Shelling, PT, DPT [phone]

## 2011-12-16 NOTE — Discharge Summary (Signed)
Physician Discharge Summary  Patient ID: Dana Gilmore MRN: [id number] DOB/AGE: 55-19-1957 55 y.o.  Admit date: 12/09/2011 Discharge date: 12/16/2011  Admission Diagnoses: Osteoarthritis right knee  Discharge Diagnoses: Osteoarthritis right knee Active Problems:  * No active hospital problems. *    Discharged Condition: stable  Hospital Course: Patient's hospital course was essentially unremarkable she underwent total knee arthroplasty progress well with physical therapy and was discharged to home in stable condition with home health physical therapy.  Consults: None  Significant Diagnostic Studies: labs: Routine labs  Treatments: surgery: Please see operative note  Discharge Exam: Blood pressure 106/58, pulse 70, temperature 98.5 F (36.9 C), temperature source Oral, resp. rate 18, height 6\' 1"  (1.854 m), weight 103.42 kg (228 lb), SpO2 98.00%. Incision/Wound: incision clean and dry at time of discharge  Disposition: 06-Home-Health Care Svc  Discharge Orders    Future Orders Please Complete By Expires   Diet - low sodium heart healthy      Diet - low sodium heart healthy      Call MD / Call 911      Comments:   If you experience chest pain or shortness of breath, CALL 911 and be transported to the hospital emergency room.  If you develope a fever above 101 F, pus (white drainage) or increased drainage or redness at the wound, or calf pain, call your surgeon's office.   Constipation Prevention      Comments:   Drink plenty of fluids.  Prune juice may be helpful.  You may use a stool softener, such as Colace (over the counter) 100 mg twice a day.  Use MiraLax (over the counter) for constipation as needed.   Increase activity slowly as tolerated      Weight Bearing as taught in Physical Therapy      Comments:   Use a walker or crutches as instructed.   Discharge instructions      Call MD / Call 911      Comments:   If you experience chest pain or shortness of breath, CALL 911  and be transported to the hospital emergency room.  If you develope a fever above 101 F, pus (white drainage) or increased drainage or redness at the wound, or calf pain, call your surgeon's office.   Constipation Prevention      Comments:   Drink plenty of fluids.  Prune juice may be helpful.  You may use a stool softener, such as Colace (over the counter) 100 mg twice a day.  Use MiraLax (over the counter) for constipation as needed.   Increase activity slowly as tolerated      Weight Bearing as taught in Physical Therapy      Comments:   Use a walker or crutches as instructed.     Medication List  As of 12/16/2011  7:30 AM   TAKE these medications         albuterol 108 (90 BASE) MCG/ACT inhaler   Commonly known as: PROVENTIL HFA;VENTOLIN HFA   Inhale 2 puffs into the lungs every 6 (six) hours as needed. For shortness of breath      amLODipine-benazepril 5-10 MG per capsule   Commonly known as: LOTREL   Take 1 capsule by mouth every morning.      beclomethasone 40 MCG/ACT inhaler   Commonly known as: QVAR   Inhale 2 puffs into the lungs daily as needed. For shortness of breath      clonazePAM 2 MG tablet   Commonly  known as: KLONOPIN   Take 2 mg by mouth 2 (two) times daily.      HYDROcodone-acetaminophen 5-500 MG per tablet   Commonly known as: VICODIN   Take 1 tablet by mouth every 6 (six) hours as needed for pain.      omeprazole 20 MG capsule   Commonly known as: PRILOSEC   Take 20 mg by mouth 2 (two) times daily.      oxyCODONE-acetaminophen 10-325 MG per tablet   Commonly known as: PERCOCET   Take 1 tablet by mouth 4 (four) times daily.      oxyCODONE-acetaminophen 5-325 MG per tablet   Commonly known as: PERCOCET   Take 1 tablet by mouth every 4 (four) hours as needed for pain.      promethazine 25 MG tablet   Commonly known as: PHENERGAN   Take 25 mg by mouth every 6 (six) hours as needed. For nausea      simvastatin 40 MG tablet   Commonly known as: ZOCOR    Take 40 mg by mouth every evening.      VOLTAREN 1 % Gel   Generic drug: diclofenac sodium   Apply 1 application topically 2 (two) times daily.      warfarin 1 MG tablet   Commonly known as: COUMADIN   Take 1 tablet (1 mg total) by mouth daily.             Signed: Yared Susan V 12/16/2011, 7:30 AM

## 2013-02-05 ENCOUNTER — Encounter (HOSPITAL_COMMUNITY): Payer: Self-pay | Admitting: Emergency Medicine

## 2013-02-05 ENCOUNTER — Emergency Department (HOSPITAL_COMMUNITY): Payer: Medicaid Other

## 2013-02-05 ENCOUNTER — Emergency Department (HOSPITAL_COMMUNITY)
Admission: EM | Admit: 2013-02-05 | Discharge: 2013-02-05 | Disposition: A | Discharge status: Home / Self Care | Payer: Medicaid Other | Attending: Emergency Medicine | Admitting: Emergency Medicine

## 2013-02-05 DIAGNOSIS — R4701 Aphasia: Secondary | ICD-10-CM | POA: Insufficient documentation

## 2013-02-05 DIAGNOSIS — F3289 Other specified depressive episodes: Secondary | ICD-10-CM | POA: Insufficient documentation

## 2013-02-05 DIAGNOSIS — IMO0002 Reserved for concepts with insufficient information to code with codable children: Secondary | ICD-10-CM | POA: Insufficient documentation

## 2013-02-05 DIAGNOSIS — E7800 Pure hypercholesterolemia, unspecified: Secondary | ICD-10-CM | POA: Insufficient documentation

## 2013-02-05 DIAGNOSIS — Z79899 Other long term (current) drug therapy: Secondary | ICD-10-CM | POA: Insufficient documentation

## 2013-02-05 DIAGNOSIS — F329 Major depressive disorder, single episode, unspecified: Secondary | ICD-10-CM | POA: Insufficient documentation

## 2013-02-05 DIAGNOSIS — Z87891 Personal history of nicotine dependence: Secondary | ICD-10-CM | POA: Insufficient documentation

## 2013-02-05 DIAGNOSIS — I10 Essential (primary) hypertension: Secondary | ICD-10-CM | POA: Insufficient documentation

## 2013-02-05 DIAGNOSIS — R634 Abnormal weight loss: Secondary | ICD-10-CM | POA: Insufficient documentation

## 2013-02-05 DIAGNOSIS — R5381 Other malaise: Secondary | ICD-10-CM | POA: Insufficient documentation

## 2013-02-05 DIAGNOSIS — F411 Generalized anxiety disorder: Secondary | ICD-10-CM | POA: Insufficient documentation

## 2013-02-05 DIAGNOSIS — R531 Weakness: Secondary | ICD-10-CM

## 2013-02-05 DIAGNOSIS — Z8679 Personal history of other diseases of the circulatory system: Secondary | ICD-10-CM | POA: Insufficient documentation

## 2013-02-05 DIAGNOSIS — K219 Gastro-esophageal reflux disease without esophagitis: Secondary | ICD-10-CM | POA: Insufficient documentation

## 2013-02-05 DIAGNOSIS — Z8701 Personal history of pneumonia (recurrent): Secondary | ICD-10-CM | POA: Insufficient documentation

## 2013-02-05 DIAGNOSIS — R630 Anorexia: Secondary | ICD-10-CM | POA: Insufficient documentation

## 2013-02-05 DIAGNOSIS — M129 Arthropathy, unspecified: Secondary | ICD-10-CM | POA: Insufficient documentation

## 2013-02-05 LAB — URINALYSIS, ROUTINE W REFLEX MICROSCOPIC
Bilirubin Urine: NEGATIVE
Glucose, UA: NEGATIVE mg/dL
Hgb urine dipstick: NEGATIVE
Ketones, ur: NEGATIVE mg/dL
Protein, ur: NEGATIVE mg/dL

## 2013-02-05 LAB — CBC WITH DIFFERENTIAL/PLATELET
Basophils Absolute: 0 10*3/uL (ref 0.0–0.1)
Basophils Relative: 1 % (ref 0–1)
Eosinophils Absolute: 0.1 10*3/uL (ref 0.0–0.7)
Eosinophils Relative: 1 % (ref 0–5)
HCT: 43.3 % (ref 36.0–46.0)
Hemoglobin: 14 g/dL (ref 12.0–15.0)
MCH: 28 pg (ref 26.0–34.0)
MCHC: 32.3 g/dL (ref 30.0–36.0)
MCV: 86.6 fL (ref 78.0–100.0)
Monocytes Absolute: 0.3 10*3/uL (ref 0.1–1.0)
Monocytes Relative: 5 % (ref 3–12)
RDW: 14.1 % (ref 11.5–15.5)

## 2013-02-05 LAB — BASIC METABOLIC PANEL
BUN: 9 mg/dL (ref 6–23)
Chloride: 103 mEq/L (ref 96–112)
Creatinine, Ser: 0.72 mg/dL (ref 0.50–1.10)
Glucose, Bld: 90 mg/dL (ref 70–99)
Potassium: 3.3 mEq/L — ABNORMAL LOW (ref 3.5–5.1)

## 2013-02-05 LAB — POCT I-STAT TROPONIN I

## 2013-02-05 MED ORDER — SODIUM CHLORIDE 0.9 % IV BOLUS (SEPSIS)
500.0000 mL | Freq: Once | INTRAVENOUS | Status: AC
  Administered 2013-02-05: 500 mL via INTRAVENOUS

## 2013-02-05 MED ORDER — ACETAMINOPHEN 325 MG PO TABS
650.0000 mg | ORAL_TABLET | Freq: Once | ORAL | Status: AC
  Administered 2013-02-05: 650 mg via ORAL
  Filled 2013-02-05: qty 2

## 2013-02-05 MED ORDER — SODIUM CHLORIDE 0.9 % IV SOLN
INTRAVENOUS | Status: DC
  Administered 2013-02-05: 21:00:00 via INTRAVENOUS

## 2013-02-05 NOTE — ED Notes (Signed)
Per ems: Patient reports that she has been sick for the last couple of days. She has dizziness and SOB. The patient has rhoncii. The patient is alert.

## 2013-02-05 NOTE — ED Notes (Signed)
Patient states that she has been declining for the last 2 -3 days. She states that she noticed that she had sluggish speech since Friday. She also reports that she has had a deep course cough. The patient is anxious at this time. PAtient states that she is dizzy and having trouble hearing - when she hears people talking it is like in a tunnel

## 2013-02-05 NOTE — ED Notes (Signed)
[id number]<AV> Expected date:<BR> Expected time:<BR> Means of arrival:<BR> Comments:<BR> 57 y/o F, SOB, weakness, HTN

## 2013-02-05 NOTE — ED Provider Notes (Signed)
History    CSN: [id number] Arrival date & time 02/05/13  1753  First MD Initiated Contact with Patient 02/05/13 1759     Chief Complaint  Patient presents with  . Aphasia  . Dizziness  . Cough  . Hypertension   (Consider location/radiation/quality/duration/timing/severity/associated sxs/prior Treatment) HPI Comments: Dana Gilmore is a 57 y.o. female who complains of general malaise, weight loss, dizziness, shortness of breath, and anorexia, progressive, over the last month. She has lost 100 pounds over the last 6 months. She is in town to care for her daughter had a hip fracture. There's been no fever or chills, diarrhea, dysuria, ataxia, or paresthesia. No other known modifying factors.  Patient is a 57 y.o. female presenting with cough and hypertension. The history is provided by the patient.  Cough Hypertension   Past Medical History  Diagnosis Date  . High cholesterol   . Anxiety   . Depression   . Hypertension     dr Huntley Dec furr    [facility]  [phone]  . GERD (gastroesophageal reflux disease)   . PONV (postoperative nausea and vomiting)   . Walking pneumonia     h/o  . Shortness of breath 12/09/11    "when I lay down"  . Shortness of breath on exertion     "sometimes"  . Headache(784.0)     "every other day or so"  . Spinal headache   . Migraines 12/09/11    "only when I'm in the hospital"  . Arthritis     "knees full"   Past Surgical History  Procedure Laterality Date  . Replacement total knee  12/09/11    right  . Knee surgery  1988    "chip off my left kneecap"  . Joint replacement    . Fracture surgery    . Ankle fracture surgery  01/2007; 04/2007; 09/2008    right; S/P fall  . Tonsillectomy and adenoidectomy  ~ 2004    "and uvula"  . Left oophorectomy  1983  . Tubal ligation  1985  . Total knee arthroplasty  12/09/2011    Procedure: TOTAL KNEE ARTHROPLASTY;  Surgeon: Nadara Mustard, MD;  Location: [facility] OR;  Service: Orthopedics;  Laterality: Right;   Right Total Knee Arthroplasty   History reviewed. No pertinent family history. History  Substance Use Topics  . Smoking status: Former Smoker -- 0.50 packs/day for 35 years    Types: Cigarettes    Quit date: 11/02/2011  . Smokeless tobacco: Never Used  . Alcohol Use: Yes     Comment: 12/09/11 "last alcohol 8-10 yr ago"   OB History   Grav Para Term Preterm Abortions TAB SAB Ect Mult Living                 Review of Systems  Respiratory: Positive for cough.   All other systems reviewed and are negative.    Allergies  Ibuprofen; Neurontin; Pregabalin; and Tylenol  Home Medications   Current Outpatient Rx  Name  Route  Sig  Dispense  Refill  . albuterol (PROVENTIL HFA;VENTOLIN HFA) 108 (90 BASE) MCG/ACT inhaler   Inhalation   Inhale 2 puffs into the lungs every 6 (six) hours as needed. For shortness of breath         . amLODipine-benazepril (LOTREL) 5-10 MG per capsule   Oral   Take 1 capsule by mouth every morning.         . beclomethasone (QVAR) 40 MCG/ACT inhaler  Inhalation   Inhale 2 puffs into the lungs daily as needed. For shortness of breath         . clonazePAM (KLONOPIN) 2 MG tablet   Oral   Take 2 mg by mouth 3 (three) times daily as needed.          . diclofenac sodium (VOLTAREN) 1 % GEL   Topical   Apply 1 application topically 2 (two) times daily.          Marland Kitchen escitalopram (LEXAPRO) 20 MG tablet   Oral   Take 20 mg by mouth daily.         . methocarbamol (ROBAXIN) 500 MG tablet   Oral   Take 500 mg by mouth 2 (two) times daily.         Marland Kitchen omeprazole (PRILOSEC) 20 MG capsule   Oral   Take 20 mg by mouth 2 (two) times daily.         Marland Kitchen oxyCODONE-acetaminophen (PERCOCET) 10-325 MG per tablet   Oral   Take 1 tablet by mouth 4 (four) times daily.         . promethazine (PHENERGAN) 25 MG tablet   Oral   Take 25 mg by mouth every 6 (six) hours as needed. For nausea         . simvastatin (ZOCOR) 40 MG tablet   Oral   Take 40 mg  by mouth every evening.          BP 143/64  Pulse 54  Temp(Src) 98.1 F (36.7 C) (Oral)  Resp 11  SpO2 99% Physical Exam  Nursing note and vitals reviewed. Constitutional: She is oriented to person, place, and time. She appears well-developed and well-nourished. No distress.  HENT:  Head: Normocephalic and atraumatic.  Eyes: Conjunctivae and EOM are normal. Pupils are equal, round, and reactive to light.  Neck: Normal range of motion and phonation normal. Neck supple.  Cardiovascular: Normal rate, regular rhythm and intact distal pulses.   Pulmonary/Chest: Effort normal and breath sounds normal. She exhibits no tenderness.  Abdominal: Soft. She exhibits no distension. There is no tenderness. There is no guarding.  Musculoskeletal: Normal range of motion.  Neurological: She is alert and oriented to person, place, and time. She has normal strength. She exhibits normal muscle tone.  Skin: Skin is warm and dry.  Psychiatric: She has a normal mood and affect. Her behavior is normal. Judgment and thought content normal.    ED Course  Procedures (including critical care time)  Medications  sodium chloride 0.9 % bolus 500 mL (0 mLs Intravenous Stopped 02/05/13 2104)  acetaminophen (TYLENOL) tablet 650 mg (650 mg Oral Given 02/05/13 2006)    Patient Vitals for the past 24 hrs:  BP Temp Temp src Pulse Resp SpO2  02/05/13 2255 143/64 mmHg - - - - -  02/05/13 2245 - - - 54 11 99 %  02/05/13 2035 137/66 mmHg - - 56 18 100 %  02/05/13 2011 - 98.1 F (36.7 C) - - - -  02/05/13 1936 132/81 mmHg - - 73 - -  02/05/13 1934 146/63 mmHg - - 61 - -  02/05/13 1933 154/70 mmHg - - 58 - -  02/05/13 1755 164/70 mmHg 97.7 F (36.5 C) Oral 75 16 100 %      Date: 02/05/13  Rate: 71  Rhythm: normal sinus rhythm  QRS Axis: normal  PR and QT Intervals: normal  ST/T Wave abnormalities: normal  PR and QRS Conduction Disutrbances:none  Narrative Interpretation:   Old EKG Reviewed: Unchanged, from  08/21/08 Reeval at D/C; she is comfortable and has tolerated oral fluids.No further c/o  Labs Reviewed  BASIC METABOLIC PANEL - Abnormal; Notable for the following:    Potassium 3.3 (*)    All other components within normal limits  URINE CULTURE  CBC WITH DIFFERENTIAL  GLUCOSE, CAPILLARY  URINALYSIS, ROUTINE W REFLEX MICROSCOPIC  POCT I-STAT TROPONIN I   Dg Chest 2 View  02/05/2013   *RADIOLOGY REPORT*  Clinical Data: Cough  CHEST - 2 VIEW  Comparison: Prior radiograph from 12/03/2011  Findings: The cardiac and mediastinal silhouettes are stable in size and contour, and may be within normal limits.  The lungs are clear without airspace consolidation, pleural effusion, or pulmonary edema.  There is no pneumothorax.  No acute osseous abnormality.  IMPRESSION: Clear lungs.   Original Report Authenticated By: Rise Mu, M.D.   Ct Head Wo Contrast  02/05/2013   *RADIOLOGY REPORT*  Clinical Data: Sluggish speech with dizziness and cough for 3 days.  CT HEAD WITHOUT CONTRAST  Technique:  Contiguous axial images were obtained from the base of the skull through the vertex without contrast.  Comparison: None.  Findings: There is no evidence of acute intracranial hemorrhage, mass lesion, brain edema or extra-axial fluid collection.  The ventricles and subarachnoid spaces are appropriately sized for age. There is no CT evidence of acute cortical infarction.  There is minimal dependent density in the right division of the sphenoid sinus.  The visualized paranasal sinuses, mastoid air cells and middle ears are otherwise clear. The calvarium is intact.  IMPRESSION: No acute intracranial findings.  Partial opacification of the right sphenoid sinus.   Original Report Authenticated By: Carey Bullocks, M.D.   1. Weakness     MDM  Nonspecific malaise. ED evaluation remarkable for only mild volume depletion. Doubt metabolic instability, serious bacterial infection or impending vascular collapse; the  patient is stable for discharge.  Nursing Notes Reviewed/ Care Coordinated Applicable Imaging Reviewed Interpretation of Laboratory Data incorporated into ED treatment  Plan: Home Medications- no new meds; Home Treatments- usual medications; return here if the recommended treatment, does not improve the symptoms; Recommended follow up- PCP of choice in 1-2 weeks.   Flint Melter, MD 02/06/13 [phone]

## 2013-02-07 LAB — URINE CULTURE: Colony Count: 5000

## 2015-08-24 ENCOUNTER — Encounter (HOSPITAL_BASED_OUTPATIENT_CLINIC_OR_DEPARTMENT_OTHER): Payer: Self-pay | Admitting: Emergency Medicine

## 2015-08-24 ENCOUNTER — Emergency Department (HOSPITAL_BASED_OUTPATIENT_CLINIC_OR_DEPARTMENT_OTHER): Payer: Medicaid Other

## 2015-08-24 ENCOUNTER — Emergency Department (HOSPITAL_BASED_OUTPATIENT_CLINIC_OR_DEPARTMENT_OTHER)
Admission: EM | Admit: 2015-08-24 | Discharge: 2015-08-24 | Disposition: A | Discharge status: Home / Self Care | Payer: Medicaid Other | Attending: Emergency Medicine | Admitting: Emergency Medicine

## 2015-08-24 DIAGNOSIS — S52592A Other fractures of lower end of left radius, initial encounter for closed fracture: Secondary | ICD-10-CM | POA: Insufficient documentation

## 2015-08-24 DIAGNOSIS — W1839XA Other fall on same level, initial encounter: Secondary | ICD-10-CM | POA: Insufficient documentation

## 2015-08-24 DIAGNOSIS — Z8701 Personal history of pneumonia (recurrent): Secondary | ICD-10-CM | POA: Diagnosis not present

## 2015-08-24 DIAGNOSIS — S52502A Unspecified fracture of the lower end of left radius, initial encounter for closed fracture: Secondary | ICD-10-CM

## 2015-08-24 DIAGNOSIS — Y998 Other external cause status: Secondary | ICD-10-CM | POA: Insufficient documentation

## 2015-08-24 DIAGNOSIS — Y93E5 Activity, floor mopping and cleaning: Secondary | ICD-10-CM | POA: Insufficient documentation

## 2015-08-24 DIAGNOSIS — F419 Anxiety disorder, unspecified: Secondary | ICD-10-CM | POA: Diagnosis not present

## 2015-08-24 DIAGNOSIS — E7800 Pure hypercholesterolemia, unspecified: Secondary | ICD-10-CM | POA: Insufficient documentation

## 2015-08-24 DIAGNOSIS — Z8739 Personal history of other diseases of the musculoskeletal system and connective tissue: Secondary | ICD-10-CM | POA: Insufficient documentation

## 2015-08-24 DIAGNOSIS — F329 Major depressive disorder, single episode, unspecified: Secondary | ICD-10-CM | POA: Insufficient documentation

## 2015-08-24 DIAGNOSIS — Y9289 Other specified places as the place of occurrence of the external cause: Secondary | ICD-10-CM | POA: Insufficient documentation

## 2015-08-24 DIAGNOSIS — K219 Gastro-esophageal reflux disease without esophagitis: Secondary | ICD-10-CM | POA: Diagnosis not present

## 2015-08-24 DIAGNOSIS — Z87891 Personal history of nicotine dependence: Secondary | ICD-10-CM | POA: Insufficient documentation

## 2015-08-24 DIAGNOSIS — I10 Essential (primary) hypertension: Secondary | ICD-10-CM | POA: Insufficient documentation

## 2015-08-24 DIAGNOSIS — Z79899 Other long term (current) drug therapy: Secondary | ICD-10-CM | POA: Diagnosis not present

## 2015-08-24 DIAGNOSIS — S6992XA Unspecified injury of left wrist, hand and finger(s), initial encounter: Secondary | ICD-10-CM | POA: Diagnosis present

## 2015-08-24 DIAGNOSIS — Z791 Long term (current) use of non-steroidal anti-inflammatories (NSAID): Secondary | ICD-10-CM | POA: Diagnosis not present

## 2015-08-24 MED ORDER — OXYCODONE-ACETAMINOPHEN 5-325 MG PO TABS
2.0000 | ORAL_TABLET | Freq: Once | ORAL | Status: AC
  Administered 2015-08-24: 2 via ORAL
  Filled 2015-08-24: qty 2

## 2015-08-24 NOTE — ED Notes (Signed)
PA at bedside.

## 2015-08-24 NOTE — ED Notes (Signed)
Pt states she fell 2 weeks ago on kitchen floor, reports that she used left wrist to pull self up off floor, denies landing on wrist

## 2015-08-24 NOTE — ED Provider Notes (Signed)
CSN: [id number]     Arrival date & time 08/24/15  1143 History   First MD Initiated Contact with Patient 08/24/15 1203     Chief Complaint  Patient presents with  . Wrist Pain     (Consider location/radiation/quality/duration/timing/severity/associated sxs/prior Treatment) HPI Comments: Patient presents with complaint of left wrist pain starting approximately 2 weeks ago after she fell while mopping. Patient denies landing on her wrist but states that her pain began when she was pushing herself up off the floor against the wall. She has had ongoing pain despite pain medication. The numbness or tingling. No other injury. Patient reports being completely out of pain medications. Patient states that she has seen [facility] in the past.  [place] substance database shows that patient had #120 Percocet 10-325 filled on 08/07/2015. Review records show that she was discharged from her pain management practice November 2016 due to positive UDS for cocaine.  Patient is a 60 y.o. female presenting with wrist pain. The history is provided by the patient.  Wrist Pain Associated symptoms include arthralgias. Pertinent negatives include no joint swelling, neck pain, numbness or weakness.    Past Medical History  Diagnosis Date  . High cholesterol   . Anxiety   . Depression   . Hypertension     dr Huntley Dec [facility]    [facility]  [phone]  . GERD (gastroesophageal reflux disease)   . PONV (postoperative nausea and vomiting)   . Walking pneumonia     h/o  . Shortness of breath 12/09/11    "when I lay down"  . Shortness of breath on exertion     "sometimes"  . Headache(784.0)     "every other day or so"  . Spinal headache   . Migraines 12/09/11    "only when I'm in the hospital"  . Arthritis     "knees full"   Past Surgical History  Procedure Laterality Date  . Replacement total knee  12/09/11    right  . Knee surgery  1988    "chip off my left kneecap"  . Joint replacement     . Fracture surgery    . Ankle fracture surgery  01/2007; 04/2007; 09/2008    right; S/P fall  . Tonsillectomy and adenoidectomy  ~ 2004    "and uvula"  . Left oophorectomy  1983  . Tubal ligation  1985  . Total knee arthroplasty  12/09/2011    Procedure: TOTAL KNEE ARTHROPLASTY;  Surgeon: Nadara Mustard, MD;  Location: [facility] OR;  Service: Orthopedics;  Laterality: Right;  Right Total Knee Arthroplasty   History reviewed. No pertinent family history. Social History  Substance Use Topics  . Smoking status: Former Smoker -- 0.50 packs/day for 35 years    Types: Cigarettes    Quit date: 11/02/2011  . Smokeless tobacco: Never Used  . Alcohol Use: Yes     Comment: 12/09/11 "last alcohol 8-10 yr ago"   OB History    No data available     Review of Systems  Constitutional: Negative for activity change.  Musculoskeletal: Positive for arthralgias. Negative for back pain, joint swelling and neck pain.  Skin: Negative for wound.  Neurological: Negative for weakness and numbness.    Allergies  Ibuprofen; Neurontin; Pregabalin; and Tylenol  Home Medications   Prior to Admission medications   Medication Sig Start Date End Date Taking? Authorizing Provider  albuterol (PROVENTIL HFA;VENTOLIN HFA) 108 (90 BASE) MCG/ACT inhaler Inhale 2 puffs into the  lungs every 6 (six) hours as needed. For shortness of breath    Historical Provider, MD  amLODipine-benazepril (LOTREL) 5-10 MG per capsule Take 1 capsule by mouth every morning.    Historical Provider, MD  beclomethasone (QVAR) 40 MCG/ACT inhaler Inhale 2 puffs into the lungs daily as needed. For shortness of breath    Historical Provider, MD  clonazePAM (KLONOPIN) 2 MG tablet Take 2 mg by mouth 3 (three) times daily as needed.     Historical Provider, MD  diclofenac sodium (VOLTAREN) 1 % GEL Apply 1 application topically 2 (two) times daily.     Historical Provider, MD  escitalopram (LEXAPRO) 20 MG tablet Take 20 mg by mouth daily.    Historical  Provider, MD  methocarbamol (ROBAXIN) 500 MG tablet Take 500 mg by mouth 2 (two) times daily.    Historical Provider, MD  omeprazole (PRILOSEC) 20 MG capsule Take 20 mg by mouth 2 (two) times daily.    Historical Provider, MD  oxyCODONE-acetaminophen (PERCOCET) 10-325 MG per tablet Take 1 tablet by mouth 4 (four) times daily.    Historical Provider, MD  promethazine (PHENERGAN) 25 MG tablet Take 25 mg by mouth every 6 (six) hours as needed. For nausea    Historical Provider, MD  simvastatin (ZOCOR) 40 MG tablet Take 40 mg by mouth every evening.    Historical Provider, MD   BP 190/99 mmHg  Pulse 80  Temp(Src) 97.8 F (36.6 C) (Oral)  Resp 18  Ht 5\' 10"  (1.778 m)  Wt 107.956 kg  BMI 34.15 kg/m2  SpO2 99%   Physical Exam  Constitutional: She appears well-developed and well-nourished.  HENT:  Head: Normocephalic and atraumatic.  Eyes: Pupils are equal, round, and reactive to light.  Neck: Normal range of motion. Neck supple.  Cardiovascular: Exam reveals no decreased pulses.   Musculoskeletal: She exhibits tenderness. She exhibits no edema.       Left shoulder: Normal.       Left elbow: Normal.       Left wrist: She exhibits decreased range of motion, tenderness and bony tenderness. She exhibits no swelling.       Left upper arm: Normal.       Left forearm: Normal.       Left hand: Normal.  Neurological: She is alert. No sensory deficit.  Motor, sensation, and vascular distal to the injury is fully intact.   Skin: Skin is warm and dry.  Psychiatric: She has a normal mood and affect.  Nursing note and vitals reviewed.   ED Course  Procedures (including critical care time) Labs Review Labs Reviewed - No data to display  Imaging Review Dg Wrist Complete Left  08/24/2015  CLINICAL DATA:  Fall 2 weeks ago. Persistent left wrist pain and deformity. Initial encounter. EXAM: LEFT WRIST - COMPLETE 3+ VIEW COMPARISON:  None. FINDINGS: A comminuted fracture seen involving the distal  radial metaphysis, with intra-articular extension into both the radiocarpal and distal radioulnar joints. Mild impaction demonstrated, however fracture fragments remain in near anatomic alignment. Carpal bones remain normal in appearance and alignment. IMPRESSION: Comminuted fracture of distal radius, with intra-articular extension into the radiocarpal and distal radioulnar joints. Electronically Signed   By: Myles RosenthalJohn  Stahl M.D.   On: 08/24/2015 12:32   I have personally reviewed and evaluated these images and lab results as part of my medical decision-making.   EKG Interpretation None       Patient seen and examined.    Vital signs reviewed and  are as follows: BP 190/99 mmHg  Pulse 80  Temp(Src) 97.8 F (36.6 C) (Oral)  Resp 18  Ht 5\' 10"  (1.778 m)  Wt 107.956 kg  BMI 34.15 kg/m2  SpO2 99%   Sugar tongue splint ordered. She has seen [facility] in the past.   Discussed pain medication use with patient. She states that she has taken all of the medication prescribed on 12/28. I informed patient that given her chronic medication use, and inappropriate use of her medications, I will only be able to give her medication here in emergency department today will not be able to give her a prescription for home. Patient verbalizes understanding and agrees with plan.   Discussed RICE protocol.   MDM   Final diagnoses:  Distal radius fracture, left, closed, initial encounter   Distal radius fracture. Upper extremities neurovascularly intact. Fracture is in near-anatomic alignment and does not require reduction today. Patient will need orthopedic follow-up to ensure appropriate healing.   Renne Crigler, PA-C 08/24/15 1311  Pricilla Loveless, MD 08/26/15 [phone]

## 2015-08-24 NOTE — Discharge Instructions (Signed)
Please read and follow all provided instructions.  Your diagnoses today include:  1. Distal radius fracture, left, closed, initial encounter     Tests performed today include:  An x-ray of the affected area - shows distal radius fracture  Vital signs. See below for your results today.   Medications prescribed:   None  Take any prescribed medications only as directed.  Home care instructions:   Follow any educational materials contained in this packet  Follow R.I.C.E. Protocol:  R - rest your injury   I  - use ice on injury without applying directly to skin  C - compress injury with bandage or splint  E - elevate the injury as much as possible  Follow-up instructions: Contact your doctor regarding pain management.   Follow-up with your orthopedist in the next week.   See your doctor for recheck of your blood pressure.   Return instructions:   Please return if your fingers are numb or tingling, appear gray or blue, or you have severe pain (also elevate the arm and loosen splint or wrap if you were given one)  Please return to the Emergency Department if you experience worsening symptoms.   Please return if you have any other emergent concerns.  Additional Information:  Your vital signs today were: BP 190/99 mmHg   Pulse 80   Temp(Src) 97.8 F (36.6 C) (Oral)   Resp 18   Ht 5\' 10"  (1.778 m)   Wt 107.956 kg   BMI 34.15 kg/m2   SpO2 99% If your blood pressure (BP) was elevated above 135/85 this visit, please have this repeated by your doctor within one month. --------------

## 2016-04-17 ENCOUNTER — Encounter (HOSPITAL_COMMUNITY): Payer: Self-pay | Admitting: Emergency Medicine

## 2016-04-17 ENCOUNTER — Emergency Department (HOSPITAL_COMMUNITY)
Admission: EM | Admit: 2016-04-17 | Discharge: 2016-04-18 | Disposition: A | Discharge status: Home / Self Care | Payer: Medicaid Other | Attending: Emergency Medicine | Admitting: Emergency Medicine

## 2016-04-17 DIAGNOSIS — F411 Generalized anxiety disorder: Secondary | ICD-10-CM | POA: Insufficient documentation

## 2016-04-17 DIAGNOSIS — I10 Essential (primary) hypertension: Secondary | ICD-10-CM | POA: Diagnosis not present

## 2016-04-17 DIAGNOSIS — Z79891 Long term (current) use of opiate analgesic: Secondary | ICD-10-CM | POA: Diagnosis not present

## 2016-04-17 DIAGNOSIS — Z7951 Long term (current) use of inhaled steroids: Secondary | ICD-10-CM | POA: Insufficient documentation

## 2016-04-17 DIAGNOSIS — F17210 Nicotine dependence, cigarettes, uncomplicated: Secondary | ICD-10-CM | POA: Diagnosis not present

## 2016-04-17 DIAGNOSIS — F1910 Other psychoactive substance abuse, uncomplicated: Secondary | ICD-10-CM | POA: Insufficient documentation

## 2016-04-17 DIAGNOSIS — Z79899 Other long term (current) drug therapy: Secondary | ICD-10-CM | POA: Diagnosis not present

## 2016-04-17 DIAGNOSIS — R45851 Suicidal ideations: Secondary | ICD-10-CM | POA: Diagnosis present

## 2016-04-17 LAB — COMPREHENSIVE METABOLIC PANEL
ALBUMIN: 4.9 g/dL (ref 3.5–5.0)
ALK PHOS: 94 U/L (ref 38–126)
ALT: 23 U/L (ref 14–54)
ANION GAP: 10 (ref 5–15)
AST: 31 U/L (ref 15–41)
BILIRUBIN TOTAL: 0.5 mg/dL (ref 0.3–1.2)
BUN: 8 mg/dL (ref 6–20)
CALCIUM: 9.2 mg/dL (ref 8.9–10.3)
CO2: 25 mmol/L (ref 22–32)
Chloride: 94 mmol/L — ABNORMAL LOW (ref 101–111)
Creatinine, Ser: 0.87 mg/dL (ref 0.44–1.00)
GFR calc Af Amer: >60 mL/min (ref >60)
GFR calc non Af Amer: >60 mL/min (ref >60)
GLUCOSE: 118 mg/dL — AB (ref 65–99)
Potassium: 3.8 mmol/L (ref 3.5–5.1)
SODIUM: 129 mmol/L — AB (ref 135–145)
Total Protein: 8.4 g/dL — ABNORMAL HIGH (ref 6.5–8.1)

## 2016-04-17 LAB — RAPID URINE DRUG SCREEN, HOSP PERFORMED
Amphetamines: NOT DETECTED
BARBITURATES: NOT DETECTED
Benzodiazepines: NOT DETECTED
Cocaine: NOT DETECTED
Opiates: NOT DETECTED
TETRAHYDROCANNABINOL: NOT DETECTED

## 2016-04-17 LAB — CBC WITH DIFFERENTIAL/PLATELET
Basophils Absolute: 0 10*3/uL (ref 0.0–0.1)
Basophils Relative: 0 %
EOS ABS: 0.1 10*3/uL (ref 0.0–0.7)
Eosinophils Relative: 1 %
HEMATOCRIT: 40.9 % (ref 36.0–46.0)
HEMOGLOBIN: 13.8 g/dL (ref 12.0–15.0)
LYMPHS ABS: 1.3 10*3/uL (ref 0.7–4.0)
Lymphocytes Relative: 22 %
MCH: 28.3 pg (ref 26.0–34.0)
MCHC: 33.7 g/dL (ref 30.0–36.0)
MCV: 84 fL (ref 78.0–100.0)
MONO ABS: 0.4 10*3/uL (ref 0.1–1.0)
MONOS PCT: 7 %
NEUTROS PCT: 70 %
Neutro Abs: 4.1 10*3/uL (ref 1.7–7.7)
Platelets: 263 10*3/uL (ref 150–400)
RBC: 4.87 MIL/uL (ref 3.87–5.11)
RDW: 14.8 % (ref 11.5–15.5)
WBC: 5.9 10*3/uL (ref 4.0–10.5)

## 2016-04-17 LAB — ETHANOL: Alcohol, Ethyl (B): <5 mg/dL (ref <5)

## 2016-04-17 MED ORDER — ACETAMINOPHEN 500 MG PO TABS
1000.0000 mg | ORAL_TABLET | Freq: Once | ORAL | Status: AC
  Administered 2016-04-17: 1000 mg via ORAL
  Filled 2016-04-17: qty 2

## 2016-04-17 MED ORDER — LISINOPRIL-HYDROCHLOROTHIAZIDE 20-12.5 MG PO TABS: 1.0000 | ORAL_TABLET | Freq: Every day | ORAL | Status: DC

## 2016-04-17 MED ORDER — CLONIDINE HCL 0.1 MG PO TABS
0.1000 mg | ORAL_TABLET | Freq: Once | ORAL | Status: AC
  Administered 2016-04-17: 0.1 mg via ORAL
  Filled 2016-04-17: qty 1

## 2016-04-17 MED ORDER — BECLOMETHASONE DIPROPIONATE 40 MCG/ACT IN AERS
2.0000 | INHALATION_SPRAY | Freq: Two times a day (BID) | RESPIRATORY_TRACT | Status: DC
  Administered 2016-04-17 – 2016-04-18 (×3): 2 via RESPIRATORY_TRACT
  Filled 2016-04-17: qty 8.7

## 2016-04-17 MED ORDER — DICLOFENAC SODIUM 1 % TD GEL
2.0000 g | Freq: Three times a day (TID) | TRANSDERMAL | Status: DC | PRN
  Administered 2016-04-17: 2 g via TOPICAL
  Filled 2016-04-17: qty 100

## 2016-04-17 MED ORDER — MAGNESIUM OXIDE 400 (241.3 MG) MG PO TABS
800.0000 mg | ORAL_TABLET | Freq: Once | ORAL | Status: DC
  Filled 2016-04-17: qty 2

## 2016-04-17 MED ORDER — POTASSIUM CHLORIDE CRYS ER 20 MEQ PO TBCR
40.0000 meq | EXTENDED_RELEASE_TABLET | Freq: Once | ORAL | Status: AC
  Administered 2016-04-17: 40 meq via ORAL
  Filled 2016-04-17: qty 2

## 2016-04-17 MED ORDER — ONDANSETRON 4 MG PO TBDP
4.0000 mg | ORAL_TABLET | Freq: Once | ORAL | Status: AC
  Administered 2016-04-17: 4 mg via ORAL
  Filled 2016-04-17: qty 1

## 2016-04-17 MED ORDER — HYDROCHLOROTHIAZIDE 12.5 MG PO CAPS
12.5000 mg | ORAL_CAPSULE | Freq: Every day | ORAL | Status: DC
  Administered 2016-04-17 – 2016-04-18 (×2): 12.5 mg via ORAL
  Filled 2016-04-17 (×2): qty 1

## 2016-04-17 MED ORDER — MAGNESIUM OXIDE 400 (241.3 MG) MG PO TABS
800.0000 mg | ORAL_TABLET | Freq: Once | ORAL | Status: AC
  Administered 2016-04-17: 800 mg via ORAL
  Filled 2016-04-17: qty 2

## 2016-04-17 MED ORDER — PANTOPRAZOLE SODIUM 40 MG PO TBEC
80.0000 mg | DELAYED_RELEASE_TABLET | Freq: Every day | ORAL | Status: DC
Start: 2016-04-17 — End: 2016-04-18
  Administered 2016-04-17 – 2016-04-18 (×2): 80 mg via ORAL
  Filled 2016-04-17 (×2): qty 2

## 2016-04-17 MED ORDER — LORAZEPAM 1 MG PO TABS
1.0000 mg | ORAL_TABLET | Freq: Four times a day (QID) | ORAL | Status: DC | PRN
  Administered 2016-04-17 (×2): 1 mg via ORAL
  Filled 2016-04-17 (×2): qty 1

## 2016-04-17 MED ORDER — LISINOPRIL 20 MG PO TABS
20.0000 mg | ORAL_TABLET | Freq: Every day | ORAL | Status: DC
  Administered 2016-04-17 – 2016-04-18 (×2): 20 mg via ORAL
  Filled 2016-04-17 (×2): qty 1
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
## 2016-04-17 NOTE — ED Notes (Signed)
Up to BR with 2 person assist.  Patient states she uses WC and bedpan at home.  Meal given.
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
## 2016-04-17 NOTE — BH Assessment (Signed)
[facility] Assessment Progress Note   Case was staffed with Shaune PollackLord DNP who recommended patient be monitored and re-evaluated in the a.m.

## 2016-04-17 NOTE — ED Notes (Signed)
Bed: WTR8 Expected date:  Expected time:  Means of arrival:  Comments: 

## 2016-04-17 NOTE — ED Notes (Signed)
C/O generalized anxiety.  PRN medication requested and received.
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
## 2016-04-18 DIAGNOSIS — F411 Generalized anxiety disorder: Secondary | ICD-10-CM | POA: Diagnosis not present

## 2016-04-18 MED ORDER — HYDROXYZINE HCL 25 MG PO TABS: 25.0000 mg | ORAL_TABLET | Freq: Four times a day (QID) | ORAL | 0 refills | Status: AC | PRN

## 2016-04-18 MED ORDER — HYDROXYZINE HCL 25 MG PO TABS
25.0000 mg | ORAL_TABLET | Freq: Four times a day (QID) | ORAL | Status: DC | PRN
  Administered 2016-04-18 (×2): 25 mg via ORAL
  Filled 2016-04-18 (×2): qty 1

## 2016-04-18 NOTE — [facility] Suicide Risk Assessment (Signed)
Suicide Risk Assessment  Discharge Assessment   [facility]   Principal Problem: Generalized anxiety disorder Discharge Diagnoses:  Patient Active Problem List   Diagnosis Date Noted  . Generalized anxiety disorder [F41.1] 04/18/2016    Priority: High    Total Time spent with patient: 45 minutes  Musculoskeletal: Strength & Muscle Tone: within normal limits Gait & Station: normal Patient leans: N/A  Psychiatric Specialty Exam: Physical Exam  Constitutional: She is oriented to person, place, and time. She appears well-developed.  HENT:  Head: Normocephalic.  Eyes: Pupils are equal, round, and reactive to light.  Cardiovascular: Normal rate.   Musculoskeletal: Normal range of motion.  Neurological: She is alert and oriented to person, place, and time.  Skin: Skin is warm and dry.  Psychiatric: Her speech is normal and behavior is normal. Judgment and thought content normal. Cognition and memory are normal. She exhibits a depressed mood.    ROS  Blood pressure 144/76, pulse 73, temperature 97.5 F (36.4 C), temperature source Oral, resp. rate 18, height 6' (1.829 m), weight 115.7 kg (255 lb), SpO2 100 %.Body mass index is 34.58 kg/m.  General Appearance: Fairly Groomed  Eye Contact:  Good  Speech:  Normal Rate  Volume:  Normal  Mood:  Anxious  Affect:  Appropriate  Thought Process:  Coherent  Orientation:  Full (Time, Place, and Person)  Thought Content:  WDL  Suicidal Thoughts:  No  Homicidal Thoughts:  No  Memory:  Immediate;   Good Recent;   Good Remote;   Good  Judgement:  Good  Insight:  Good  Psychomotor Activity:  Normal  Concentration:  Concentration: Good and Attention Span: Good  Recall:  Good  Fund of Knowledge:  Good  Language:  Good  Akathisia:  No  Handed:  Right  AIMS (if indicated):     Assets:  Communication Skills Desire for Improvement Financial Resources/Insurance Social Support Transportation  ADL's:  Intact   Cognition:  WNL  Sleep:       Mental Status Per Nursing Assessment::   On Admission:   anxiety with suicidal ideations  Demographic Factors:  Caucasian and Living alone  Loss Factors: NA  Historical Factors: NA  Risk Reduction Factors:   Sense of responsibility to family and Positive social support  Continued Clinical Symptoms:  Anxiety, mild  Cognitive Features That Contribute To Risk:  None    Suicide Risk:  Minimal: No identifiable suicidal ideation.  Patients presenting with no risk factors but with morbid ruminations; may be classified as minimal risk based on the severity of the depressive symptoms    Plan Of Care/Follow-up recommendations:  Activity:  as tolerated Diet:  heart healthy diet  Galileah Piggee, NP 04/18/2016, 12:54 PM

## 2016-04-18 NOTE — ED Notes (Signed)
Patient advised me to call her daughter Shawna OrleansMelanie for pick up and safe place to stay at discharge. Spoke to GuytonMelanie, and she states she will pick patient up in about an hour and will provide her with a safe place to stay.

## 2016-04-18 NOTE — Consult Note (Signed)
[facility] Consult   Reason for Consult: General Anxiety Disorder Referring Physician:  Hampton Abbot Patient Identification: Dana Gilmore MRN:  [id number] Principal Diagnosis: Generalized anxiety disorder Diagnosis:   Patient Active Problem List   Diagnosis Date Noted  . Generalized anxiety disorder [F41.1] 04/18/2016    Priority: High    Total Time spent with patient: 45 minutes  Subjective:   Dana Gilmore is a 60 y.o. female patient who does not warrant inpatient admission at this time.  HPI:   Past Psychiatric History: Dana Gilmore is a 60 year old female who presents voluntarily to the ER for anxiety and with thoughts of self harm, which she currently denies on assessment with this Probation officer. Pt states she had run out of her pain medications after being discharged from services at the [facility] last week due to excessive use of pain medications. Pt also states she has had increased anxiety due to running out of her Xanax. Pt verbally contracts for safety at this time. No distress noted on assessment.  Pt denies homicidal, suicidal ideations, hallucinations or withdrawal symptoms at this time.  Risk to Self: Suicidal Ideation: No Suicidal Intent: No Is patient at risk for suicide?: No Suicidal Plan?: No Access to Means: No What has been your use of drugs/alcohol within the last 12 months?: past use in 2004 (cocaine use in 2004) How many times?: 1 Other Self Harm Risks: na Triggers for Past Attempts: Family contact Intentional Self Injurious Behavior: None Risk to Others: Homicidal Ideation: No Thoughts of Harm to Others: No Current Homicidal Intent: No Current Homicidal Plan: No Access to Homicidal Means: No Identified Victim: na History of harm to others?: No Assessment of Violence: None Noted Violent Behavior Description: na Does patient have access to weapons?: No Criminal Charges Pending?: No Does patient have a court date:  No Prior Inpatient Therapy: Prior Inpatient Therapy: Yes Prior Therapy Dates: 2015 Prior Therapy Facilty/Provider(s): [facility] Reason for Treatment: Anxiety, depression Prior Outpatient Therapy: Prior Outpatient Therapy: No Prior Therapy Dates: na Prior Therapy Facilty/Provider(s): na Reason for Treatment: na Does patient have an ACCT team?: No Does patient have Intensive In-House Services?  : No Does patient have [facility] services? : No Does patient have P4CC services?: No  Past Medical History:  Past Medical History:  Diagnosis Date  . Anxiety   . Arthritis    "knees full"  . Depression   . GERD (gastroesophageal reflux disease)   . Headache(784.0)    "every other day or so"  . High cholesterol   . Hypertension    dr Clarise Cruz furr    [facility]  [phone]  . Migraines 12/09/11   "only when I'm in the hospital"  . PONV (postoperative nausea and vomiting)   . Shortness of breath 12/09/11   "when I lay down"  . Shortness of breath on exertion    "sometimes"  . Spinal headache   . Walking pneumonia    h/o    Past Surgical History:  Procedure Laterality Date  . ANKLE FRACTURE SURGERY  01/2007; 04/2007; 09/2008   right; S/P fall  . FRACTURE SURGERY    . [place]   "chip off my left kneecap"  . LEFT OOPHORECTOMY  1983  . 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 TONSILLECTOMY AND ADENOIDECTOMY  ~ 2004   "and uvula"  . TOTAL KNEE ARTHROPLASTY  12/09/2011  Procedure: TOTAL KNEE ARTHROPLASTY;  Surgeon: Newt Minion, MD;  Location: [facility];  Service: Orthopedics;  Laterality: Right;  Right Total Knee Arthroplasty  . TUBAL LIGATION  1985   Family History: History reviewed. No pertinent family history. Family Psychiatric  History: None Social History:  History  Alcohol Use  . Yes    Comment: 12/09/11 "last alcohol 8-10 yr ago"     History  Drug Use  . Types: Marijuana    Comment: "last drug use was 1970's"    Social History    Social History  . Marital status: Divorced    Spouse name: N/A  . Number of children: N/A  . Years of education: N/A   Social History Main Topics  . Smoking status: Current Every Day Smoker    Packs/day: 0.50    Years: 35.00    Types: Cigarettes    Last attempt to quit: 11/02/2011  . Smokeless tobacco: Never Used  . Alcohol use Yes     Comment: 12/09/11 "last alcohol 8-10 yr ago"  . Drug use:     Types: Marijuana     Comment: "last drug use was 1970's"  . Sexual activity: Not Currently   Other Topics Concern  . None   Social History Narrative  . None   Additional Social History:    Allergies:   Allergies  Allergen Reactions  . Ibuprofen Other (See Comments)    Hypertension; "medical dr tells me do not take it"  . Neurontin [Gabapentin] Nausea And Vomiting  . Pregabalin Hives and Itching  . Tylenol [Acetaminophen]     No other than what is in her medication    Labs:  Results for orders placed or performed during the hospital encounter of 04/17/16 (from the past 48 hour(s))  Comprehensive metabolic panel     Status: Abnormal   Collection Time: 04/17/16  2:52 AM  Result Value Ref Range   Sodium 129 (L) 135 - 145 mmol/L   Potassium 3.8 3.5 - 5.1 mmol/L   Chloride 94 (L) 101 - 111 mmol/L   CO2 25 22 - 32 mmol/L   Glucose, Bld 118 (H) 65 - 99 mg/dL   BUN 8 6 - 20 mg/dL   Creatinine, Ser 0.87 0.44 - 1.00 mg/dL   Calcium 9.2 8.9 - 10.3 mg/dL   Total Protein 8.4 (H) 6.5 - 8.1 g/dL   Albumin 4.9 3.5 - 5.0 g/dL   AST 31 15 - 41 U/L   ALT 23 14 - 54 U/L   Alkaline Phosphatase 94 38 - 126 U/L   Total Bilirubin 0.5 0.3 - 1.2 mg/dL   GFR calc non Af Amer >60 >60 mL/min   GFR calc Af Amer >60 >60 mL/min    Comment: (NOTE) The eGFR has been calculated using the CKD EPI equation. This calculation has not been validated in all clinical situations. eGFR's persistently <60 mL/min signify possible Chronic Kidney Disease.    Anion gap 10 5 - 15  Ethanol     Status: None    Collection Time: 04/17/16  2:52 AM  Result Value Ref Range   Alcohol, Ethyl (B) <5 <5 mg/dL    Comment:        LOWEST DETECTABLE LIMIT FOR SERUM ALCOHOL IS 5 mg/dL FOR MEDICAL PURPOSES ONLY   CBC with Diff     Status: None   Collection Time: 04/17/16  2:52 AM  Result Value Ref Range   WBC 5.9 4.0 - 10.5 K/uL   RBC  4.87 3.87 - 5.11 MIL/uL   Hemoglobin 13.8 12.0 - 15.0 g/dL   HCT 40.9 36.0 - 46.0 %   MCV 84.0 78.0 - 100.0 fL   MCH 28.3 26.0 - 34.0 pg   MCHC 33.7 30.0 - 36.0 g/dL   RDW 14.8 11.5 - 15.5 %   Platelets 263 150 - 400 K/uL   Neutrophils Relative % 70 %   Neutro Abs 4.1 1.7 - 7.7 K/uL   Lymphocytes Relative 22 %   Lymphs Abs 1.3 0.7 - 4.0 K/uL   Monocytes Relative 7 %   Monocytes Absolute 0.4 0.1 - 1.0 K/uL   Eosinophils Relative 1 %   Eosinophils Absolute 0.1 0.0 - 0.7 K/uL   Basophils Relative 0 %   Basophils Absolute 0.0 0.0 - 0.1 K/uL  Urine rapid drug screen (hosp performed)not at [facility]     Status: None   Collection Time: 04/17/16  9:50 AM  Result Value Ref Range   Opiates NONE DETECTED NONE DETECTED   Cocaine NONE DETECTED NONE DETECTED   Benzodiazepines NONE DETECTED NONE DETECTED   Amphetamines NONE DETECTED NONE DETECTED   Tetrahydrocannabinol NONE DETECTED NONE DETECTED   Barbiturates NONE DETECTED NONE DETECTED    Comment:        DRUG SCREEN FOR MEDICAL PURPOSES ONLY.  IF CONFIRMATION IS NEEDED FOR ANY PURPOSE, NOTIFY LAB WITHIN 5 DAYS.        LOWEST DETECTABLE LIMITS FOR URINE DRUG SCREEN Drug Class       Cutoff (ng/mL) Amphetamine      1000 Barbiturate      200 Benzodiazepine   188 Tricyclics       416 Opiates          300 Cocaine          300 THC              50     Current Facility-Administered Medications  Medication Dose Route Frequency Provider Last Rate Last Dose  . beclomethasone (QVAR) 40 MCG/ACT inhaler 2 puff  2 puff Inhalation BID Deno Etienne, DO   2 puff at 04/18/16 0847  . diclofenac sodium (VOLTAREN) 1 % transdermal gel 2  g  2 g Topical TID PRN Virgel Manifold, MD   2 g at 04/17/16 1701  . lisinopril (PRINIVIL,ZESTRIL) tablet 20 mg  20 mg Oral Daily Deno Etienne, DO   20 mg at 04/18/16 0847   And  . hydrochlorothiazide (MICROZIDE) capsule 12.5 mg  12.5 mg Oral Daily Deno Etienne, DO   12.5 mg at 04/18/16 0848  . hydrOXYzine (ATARAX/VISTARIL) tablet 25 mg  25 mg Oral Q6H PRN Patrecia Pour, NP   25 mg at 04/18/16 0848  . pantoprazole (PROTONIX) EC tablet 80 mg  80 mg Oral Daily Deno Etienne, DO   80 mg at 04/18/16 6063   Current Outpatient Prescriptions  Medication Sig Dispense Refill  . albuterol (PROVENTIL HFA;VENTOLIN HFA) 108 (90 BASE) MCG/ACT inhaler Inhale 2 puffs into the lungs every 6 (six) hours as needed. For shortness of breath    . beclomethasone (QVAR) 40 MCG/ACT inhaler Inhale 2 puffs into the lungs daily as needed. For shortness of breath    . clonazePAM (KLONOPIN) 2 MG tablet Take 2 mg by mouth 3 (three) times daily as needed for anxiety.     . diclofenac sodium (VOLTAREN) 1 % GEL Apply 1 application topically 2 (two) times daily.     . DULoxetine (CYMBALTA) 60 MG capsule Take 60 mg  by mouth daily.    Marland Kitchen lisinopril-hydrochlorothiazide (PRINZIDE,ZESTORETIC) 20-12.5 MG tablet Take 1 tablet by mouth daily.  5  . methocarbamol (ROBAXIN) 500 MG tablet Take 500 mg by mouth 2 (two) times daily.    . Multiple Vitamin (MULTIVITAMIN WITH MINERALS) TABS tablet Take 1 tablet by mouth daily.    Marland Kitchen omeprazole (PRILOSEC) 20 MG capsule Take 20 mg by mouth 2 (two) times daily.    Marland Kitchen oxyCODONE-acetaminophen (PERCOCET) 10-325 MG per tablet Take 1 tablet by mouth 4 (four) times daily.    . promethazine (PHENERGAN) 25 MG tablet Take 25 mg by mouth every 6 (six) hours as needed. For nausea    . simvastatin (ZOCOR) 40 MG tablet Take 40 mg by mouth every evening.      Musculoskeletal: Strength & Muscle Tone: within normal limits Gait & Station: normal Patient leans: N/A  Psychiatric Specialty Exam: Physical Exam   Constitutional: She is oriented to person, place, and time. She appears well-developed.  HENT:  Head: Normocephalic.  Eyes: Pupils are equal, round, and reactive to light.  Cardiovascular: Normal rate.   Musculoskeletal: Normal range of motion.  Neurological: She is alert and oriented to person, place, and time.  Skin: Skin is warm and dry.  Psychiatric: Her speech is normal and behavior is normal. Judgment and thought content normal. Cognition and memory are normal. She exhibits a depressed mood.    ROS  Blood pressure 144/76, pulse 73, temperature 97.5 F (36.4 C), temperature source Oral, resp. rate 18, height 6' (1.829 m), weight 115.7 kg (255 lb), SpO2 100 %.Body mass index is 34.58 kg/m.  General Appearance: Fairly Groomed  Eye Contact:  Good  Speech:  Normal Rate  Volume:  Normal  Mood:  Anxious  Affect:  Appropriate  Thought Process:  Coherent  Orientation:  Full (Time, Place, and Person)  Thought Content:  WDL  Suicidal Thoughts:  No  Homicidal Thoughts:  No  Memory:  Immediate;   Good Recent;   Good Remote;   Good  Judgement:  Good  Insight:  Good  Psychomotor Activity:  Normal  Concentration:  Concentration: Good and Attention Span: Good  Recall:  Good  Fund of Knowledge:  Good  Language:  Good  Akathisia:  No  Handed:  Right  AIMS (if indicated):     Assets:  Communication Skills Desire for Improvement Financial Resources/Insurance Social Support Transportation  ADL's:  Intact  Cognition:  WNL  Sleep:        Treatment Plan Summary: Medication management and Plan General Anxiety Disorder;  -Crisis stabilization -Medication management:  Started Vistaril 25 mg PO Q 6hrs PRN anxiety. -Individual counseling  Disposition: No evidence of imminent risk to self or others at present.    Waylan Boga, NP 04/18/2016 12:02 PM

## 2016-05-04 ENCOUNTER — Emergency Department (HOSPITAL_BASED_OUTPATIENT_CLINIC_OR_DEPARTMENT_OTHER)
Admission: EM | Admit: 2016-05-04 | Discharge: 2016-05-04 | Disposition: A | Discharge status: Home / Self Care | Payer: Medicaid Other | Attending: Emergency Medicine | Admitting: Emergency Medicine

## 2016-05-04 ENCOUNTER — Emergency Department (HOSPITAL_BASED_OUTPATIENT_CLINIC_OR_DEPARTMENT_OTHER): Payer: Medicaid Other

## 2016-05-04 ENCOUNTER — Encounter (HOSPITAL_BASED_OUTPATIENT_CLINIC_OR_DEPARTMENT_OTHER): Payer: Self-pay | Admitting: Emergency Medicine

## 2016-05-04 DIAGNOSIS — L089 Local infection of the skin and subcutaneous tissue, unspecified: Secondary | ICD-10-CM

## 2016-05-04 DIAGNOSIS — Y92002 Bathroom of unspecified non-institutional (private) residence single-family (private) house as the place of occurrence of the external cause: Secondary | ICD-10-CM | POA: Diagnosis not present

## 2016-05-04 DIAGNOSIS — W19XXXA Unspecified fall, initial encounter: Secondary | ICD-10-CM

## 2016-05-04 DIAGNOSIS — W1839XA Other fall on same level, initial encounter: Secondary | ICD-10-CM | POA: Diagnosis not present

## 2016-05-04 DIAGNOSIS — S93105A Unspecified dislocation of left toe(s), initial encounter: Secondary | ICD-10-CM

## 2016-05-04 DIAGNOSIS — Y999 Unspecified external cause status: Secondary | ICD-10-CM | POA: Insufficient documentation

## 2016-05-04 DIAGNOSIS — F17210 Nicotine dependence, cigarettes, uncomplicated: Secondary | ICD-10-CM | POA: Insufficient documentation

## 2016-05-04 DIAGNOSIS — Y939 Activity, unspecified: Secondary | ICD-10-CM | POA: Diagnosis not present

## 2016-05-04 DIAGNOSIS — I10 Essential (primary) hypertension: Secondary | ICD-10-CM | POA: Diagnosis not present

## 2016-05-04 DIAGNOSIS — S93112A Dislocation of interphalangeal joint of left great toe, initial encounter: Secondary | ICD-10-CM | POA: Diagnosis not present

## 2016-05-04 DIAGNOSIS — Z79899 Other long term (current) drug therapy: Secondary | ICD-10-CM | POA: Diagnosis not present

## 2016-05-04 DIAGNOSIS — S8991XA Unspecified injury of right lower leg, initial encounter: Secondary | ICD-10-CM | POA: Diagnosis present

## 2016-05-04 MED ORDER — BUPIVACAINE HCL (PF) 0.5 % IJ SOLN
INTRAMUSCULAR | Status: AC
  Administered 2016-05-04: 30 mL
  Filled 2016-05-04: qty 30

## 2016-05-04 MED ORDER — BUPIVACAINE HCL (PF) 0.5 % IJ SOLN
30.0000 mL | Freq: Once | INTRAMUSCULAR | Status: AC
  Administered 2016-05-04: 30 mL

## 2016-05-04 MED ORDER — DOXYCYCLINE HYCLATE 100 MG PO CAPS: 100.0000 mg | ORAL_CAPSULE | Freq: Two times a day (BID) | ORAL | 0 refills | Status: AC

## 2016-05-04 MED ORDER — TRAMADOL HCL 50 MG PO TABS: 50.0000 mg | ORAL_TABLET | Freq: Four times a day (QID) | ORAL | 0 refills | Status: AC | PRN

## 2016-05-04 MED ORDER — FENTANYL CITRATE (PF) 100 MCG/2ML IJ SOLN
25.0000 ug | Freq: Once | INTRAMUSCULAR | Status: AC
  Administered 2016-05-04: 25 ug via INTRAMUSCULAR
  Filled 2016-05-04: qty 2

## 2016-05-04 MED FILL — DOXYCYCLINE HYC 100 MG CAP: 100 | 10 days supply | Qty: 20 | Fill #0

## 2016-05-04 NOTE — ED Triage Notes (Signed)
Pt reports falling on Saturday night after trying to get up to go to bathroom, crawled to bathroom causing rug burn on right knee.  Pt then got to the bathroom and fell again.  Pt has right knee pain and left foot pain.  Pt has swelling and bruising to toes on left foot.  Pt also requesting to have left wrist x-rayed from injury in January.  Pt since has fallen 5 times on wrist.  Pt states she hit her head when falling but not sure how hard. Pt denies LOC.  Pt alert and oriented at this time.

## 2016-05-04 NOTE — ED Notes (Signed)
Pt made aware to return if symptoms worsen or if any life threatening symptoms occur.  Pt was told to call Dr. Magnus IvanBlackman as soon as possible to make appt for this afternoon.  Pt was told to tell Dr. Magnus IvanBlackman that she was seen in ED and that Dr. Radford PaxBeaton spoke to him today to work her in his schedule.  PT reports that she will not be able to make it to the office this afternoon but will schedule an appt for tomorrow.

## 2016-05-04 NOTE — ED Notes (Signed)
MD at bedside. 

## 2016-05-04 NOTE — ED Provider Notes (Signed)
**Note De-Gilmore via Obfuscation** [facility]   CSN: [id number] Arrival date & time: 05/04/16  0913     History   Chief Complaint Chief Complaint  Patient presents with  . Fall    HPI Dana Gilmore is a 60 y.o. female.  HPI Patient was brought in after falling Saturday night trying to get up to go the bathroom.  She suffered injury to her right knee and left toes.  She also suffered injury to her right ring finger.  Patient has complaint of left wrist pain also. Past Medical History:  Diagnosis Date  . Anxiety   . Arthritis    "knees full"  . Depression   . GERD (gastroesophageal reflux disease)   . Headache(784.0)    "every other day or so"  . High cholesterol   . Hypertension    dr Huntley Dec furr    [facility]  [phone]  . Migraines 12/09/11   "only when I'm in the hospital"  . PONV (postoperative nausea and vomiting)   . Shortness of breath 12/09/11   "when I lay down"  . Shortness of breath on exertion    "sometimes"  . Spinal headache   . Walking pneumonia    h/o    Patient Active Problem List   Diagnosis Date Noted  . Generalized anxiety disorder 04/18/2016    Past Surgical History:  Procedure Laterality Date  . ANKLE FRACTURE SURGERY  01/2007; 04/2007; 09/2008   right; S/P fall  . FRACTURE SURGERY    . JOINT REPLACEMENT    . KNEE SURGERY  1988   "chip off my left kneecap"  . LEFT OOPHORECTOMY  1983  . 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 TONSILLECTOMY AND ADENOIDECTOMY  ~ 2004   "and uvula"  . TOTAL KNEE ARTHROPLASTY  12/09/2011   Procedure: TOTAL KNEE ARTHROPLASTY;  Surgeon: Nadara Mustard, MD;  Location: [facility] OR;  Service: Orthopedics;  Laterality: Right;  Right Total Knee Arthroplasty  . TUBAL LIGATION  1985    OB History    Dana data available       Home Medications    Prior to Admission medications   Medication Sig Start Date End Date Taking? Authorizing Provider  albuterol (PROVENTIL HFA;VENTOLIN HFA) 108 (90 BASE) MCG/ACT inhaler Inhale 2  puffs into the lungs every 6 (six) hours as needed. For shortness of breath    Historical Provider, MD  beclomethasone (QVAR) 40 MCG/ACT inhaler Inhale 2 puffs into the lungs daily as needed. For shortness of breath    Historical Provider, MD  clonazePAM (KLONOPIN) 2 MG tablet Take 2 mg by mouth 3 (three) times daily as needed for anxiety.     Historical Provider, MD  diclofenac sodium (VOLTAREN) 1 % GEL Apply 1 application topically 2 (two) times daily.     Historical Provider, MD  doxycycline (VIBRAMYCIN) 100 MG capsule Take 1 capsule (100 mg total) by mouth 2 (two) times daily. 05/04/16   Dana Nay, MD  DULoxetine (CYMBALTA) 60 MG capsule Take 60 mg by mouth daily.    Historical Provider, MD  hydrOXYzine (ATARAX/VISTARIL) 25 MG tablet Take 1 tablet (25 mg total) by mouth every 6 (six) hours as needed for anxiety. 04/18/16   Charm Rings, NP  lisinopril-hydrochlorothiazide (PRINZIDE,ZESTORETIC) 20-12.5 MG tablet Take 1 tablet by mouth daily. 03/09/16   Historical Provider, MD  methocarbamol (ROBAXIN) 500 MG tablet Take 500 mg by mouth 2 (two) times daily.    Historical  Provider, MD  Multiple Vitamin (MULTIVITAMIN WITH MINERALS) TABS tablet Take 1 tablet by mouth daily.    Historical Provider, MD  omeprazole (PRILOSEC) 20 MG capsule Take 20 mg by mouth 2 (two) times daily.    Historical Provider, MD  oxyCODONE-acetaminophen (PERCOCET) 10-325 MG per tablet Take 1 tablet by mouth 4 (four) times daily.    Historical Provider, MD  promethazine (PHENERGAN) 25 MG tablet Take 25 mg by mouth every 6 (six) hours as needed. For nausea    Historical Provider, MD  simvastatin (ZOCOR) 40 MG tablet Take 40 mg by mouth every evening.    Historical Provider, MD  traMADol (ULTRAM) 50 MG tablet Take 1 tablet (50 mg total) by mouth every 6 (six) hours as needed. 05/04/16   Dana Nayobert Carlis Burnsworth, MD    Family History History reviewed. Dana pertinent family history.  Social History Social History  Substance Use Topics  .  Smoking status: Current Every Day Smoker    Packs/day: 0.50    Years: 35.00    Types: Cigarettes    Last attempt to quit: 11/02/2011  . Smokeless tobacco: Never Used  . Alcohol use Yes     Comment: 12/09/11 "last alcohol 8-10 yr ago"     Allergies   Ibuprofen; Neurontin [gabapentin]; Pregabalin; and Tylenol [acetaminophen]   Review of Systems Review of Systems All other systems reviewed and are negative  Physical Exam Updated Vital Signs BP 151/89 (BP Location: Right Arm)   Pulse 65   Temp 97.8 F (36.6 C) (Oral)   Resp 18   Ht 6' (1.829 m)   Wt 255 lb (115.7 kg)   SpO2 98%   BMI 34.58 kg/m   Physical Exam Physical Exam  Nursing note and vitals reviewed. Constitutional: She is oriented to person, place, and time. She appears well-developed and well-nourished. Dana distress.  HENT:  Head: Normocephalic and atraumatic.  Eyes: Pupils are equal, round, and reactive to light.  Neck: Normal range of motion.  Cardiovascular: Normal rate and intact distal pulses.   Pulmonary/Chest: Dana respiratory distress.  Abdominal: Normal appearance. She exhibits Dana distension.  Musculoskeletal: Patient has abrasion to the right patella with mild tenderness.  Dana effusion noted.  Patient has bruising to the second through fifth toes on the left foot with tenderness to palpation and movement.  Examination of the distal tip of the right ring finger reveals a purulent discharge which was opened and drained.  Cultures were obtained.  Some tenderness left wrist to palpation.   Neurological: She is alert and oriented to person, place, and time. Dana cranial nerve deficit.  Skin: Skin is warm and dry. Dana rash noted.  Psychiatric: She has a normal mood and affect. Her behavior is normal.    ED Treatments / Results  Labs (all labs ordered are listed, but only abnormal results are displayed) Labs Reviewed  AEROBIC CULTURE (SUPERFICIAL SPECIMEN)    EKG  EKG Interpretation None        Radiology Dg Wrist Complete Left  Result Date: 05/04/2016 CLINICAL DATA:  Several falls 2 days ago. Initial encounter. Previous left wrist fracture. EXAM: LEFT WRIST - COMPLETE 3+ VIEW COMPARISON:  Radiographs of 08/24/2015. FINDINGS: The bones are mildly demineralized. The previously demonstrated fracture of the distal radius has healed. There is some residual posttraumatic deformity with mild articular surface irregularity adjacent to the articulation with the lunate. There is widening of the distal radial ulnar joint which appears unchanged. Dana evidence of acute fracture or dislocation. IMPRESSION: Dana  acute osseous findings are seen. There is posttraumatic deformity of the distal radius related to the previously demonstrated fracture. Persistent widening of the distal radioulnar joint. Electronically Signed   By: Carey Bullocks M.D.   On: 05/04/2016 10:37   Dg Knee Complete 4 Views Right  Result Date: 05/04/2016 CLINICAL DATA:  Several falls 2 days ago.  Initial encounter. EXAM: RIGHT KNEE - COMPLETE 4+ VIEW COMPARISON:  12/09/2011 radiographs. FINDINGS: Status post right total knee arthroplasty. The hardware is intact without loosening. Dana evidence of acute fracture, dislocation or significant joint effusion. IMPRESSION: Dana acute osseous findings. Dana evidence of hardware loosening post total knee arthroplasty. Electronically Signed   By: Carey Bullocks M.D.   On: 05/04/2016 10:34   Dg Finger Ring Left  Result Date: 05/04/2016 CLINICAL DATA:  Several falls 2 days ago.  Initial encounter. EXAM: LEFT RING FINGER 2+V COMPARISON:  Wrist radiographs 08/24/2015. FINDINGS: The bones are mildly demineralized. Dana acute fracture or dislocation is seen. There is some irregularity of the volar plate of the distal phalanx on the lateral view, possibly an erosion. Dana focal soft tissue abnormalities are seen. IMPRESSION: Dana suspected acute osseous findings. Possible erosion involving the base of the distal  phalanx. Electronically Signed   By: Carey Bullocks M.D.   On: 05/04/2016 10:39   Dg Foot Complete Left  Result Date: 05/04/2016 CLINICAL DATA:  Several falls 2 days ago.  Initial encounter. EXAM: LEFT FOOT - COMPLETE 3+ VIEW COMPARISON:  None. FINDINGS: The bones are demineralized. On the AP oblique views, there is lateral subluxation at the second proximal interphalangeal joint. On the lateral view, this probably represents a dorsal lateral dislocation. There is a possible small associated avulsion fracture, best seen on the AP view. Dana other acute osseous findings are seen. There is talonavicular spurring. The subtalar joint has an appearance on the lateral view suggesting an underlying subtalar coalition. Dorsal forefoot soft tissue swelling noted. IMPRESSION: 1. Dislocation at the second proximal interphalangeal joint with probable associated small avulsion fracture. 2. 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 Electronically Signed   By: Carey Bullocks M.D.   On: 05/04/2016 10:42   Dg Toe 2nd Left  Result Date: 05/04/2016 CLINICAL DATA:  Post reduction of second toe dislocation. EXAM: LEFT SECOND TOE COMPARISON:  Earlier the same date. FINDINGS: 1216 hours. Single AP view submitted. There is persistent lateral subluxation at the second proximal interphalangeal joint. There is a small avulsion fracture involving the base of the middle phalanx. There did not appear to be significant residual dorsal subluxation on the lateral view obtained 1 hour ago. IMPRESSION: Stable partial reduction of dislocation at the second PIP joint. Electronically Signed   By: Carey Bullocks M.D.   On: 05/04/2016 12:37   Dg Toe 2nd Left  Result Date: 05/04/2016 CLINICAL DATA:  Fall 2 days ago, postreduction EXAM: LEFT SECOND TOE COMPARISON:  05/04/2016 FINDINGS: Two views of the left second toe submitted. Persistent mild lateral subluxation of middle phalanx from proximal interphalangeal joint. Again noted small  avulsion fracture at the base of middle phalanx. IMPRESSION: Persistent mild lateral subluxation of middle phalanx from proximal interphalangeal joint. Again noted small avulsion fracture at the base of middle phalanx. Electronically Signed   By: Natasha Mead M.D.   On: 05/04/2016 11:36    Procedures Procedures (including critical care time)  Medications Ordered in ED Medications  bupivacaine (MARCAINE) 0.5 % injection 30 mL (30 mLs Infiltration Given by Other 05/04/16 1109)  fentaNYL (SUBLIMAZE) injection  25 mcg (25 mcg Intramuscular Given 05/04/16 1141)     Initial Impression / Assessment and Plan / ED Course  I have reviewed the triage vital signs and the nursing notes.  Pertinent labs & imaging results that were available during my care of the patient were reviewed by me and considered in my medical decision making (see chart for details).  Clinical Course  I performed a digital block on the second toe left foot using half percent Marcaine.  Good anesthesia was obtained.  I attempted on 2 different occasions to relocate the toe but was not successful.  I spoke with the orthopedic doctor on call Dr. Magnus Ivan who agreed to see the patient in the office this afternoon.  I will start patient on doxycycline for the fingertip infection.  The toe was buddy taped.  Good capillary refill after attempted reduction.    Final Clinical Impressions(s) / ED Diagnoses   Final diagnoses:  Fall, initial encounter  Finger infection  Toe dislocation, left, initial encounter    New Prescriptions New Prescriptions   DOXYCYCLINE (VIBRAMYCIN) 100 MG CAPSULE    Take 1 capsule (100 mg total) by mouth 2 (two) times daily.   TRAMADOL (ULTRAM) 50 MG TABLET    Take 1 tablet (50 mg total) by mouth every 6 (six) hours as needed.     Dana Nay, MD 05/04/16 1254

## 2016-05-04 NOTE — ED Notes (Addendum)
MD at bedside to discuss plan of care with pt.

## 2016-05-04 NOTE — ED Notes (Signed)
Dr. Radford Paxbeaton at bedside.

## 2016-05-07 LAB — AEROBIC CULTURE W GRAM STAIN (SUPERFICIAL SPECIMEN)

## 2016-05-07 LAB — AEROBIC CULTURE  (SUPERFICIAL SPECIMEN)

## 2016-05-08 ENCOUNTER — Telehealth (HOSPITAL_COMMUNITY): Payer: Self-pay

## 2016-05-08 NOTE — Telephone Encounter (Signed)
Post ED Visit - Positive Culture Follow-up  Culture report reviewed by antimicrobial stewardship pharmacist:  []  Dana Gilmore, Pharm.D. []  Dana Gilmore, Pharm.D., BCPS []  Dana Gilmore, Pharm.D. []  Dana Gilmore, Pharm.D., BCPS []  Dana Gilmore, [place] BCPS, AAHIVP []  Dana Gilmore, Pharm.D., BCPS, AAHIVP []  Dana Gilmore, Pharm.D. []  Dana Gilmore, [address] BoulevardPharm[address] Gertie GowdaX  Dana Gilmore, Pharm.D.  Positive wound culture, Abundant Staph. Aureus  Treated with Doxycycline, organism sensitive to the same and no further patient follow-up is required at this time.  Dana Gilmore, Dana Gilmore 05/08/2016, 12:34 PM

## 2018-06-27 IMAGING — CR DG FINGER RING 2+V*L*
3 series · 3 of 3 positions shown · non-contrast
Comparison: Wrist radiographs 08/24/2015.

CLINICAL DATA: Several falls 2 days ago.  Initial encounter.

EXAM:
LEFT RING FINGER 2+V

[x finger pa left]
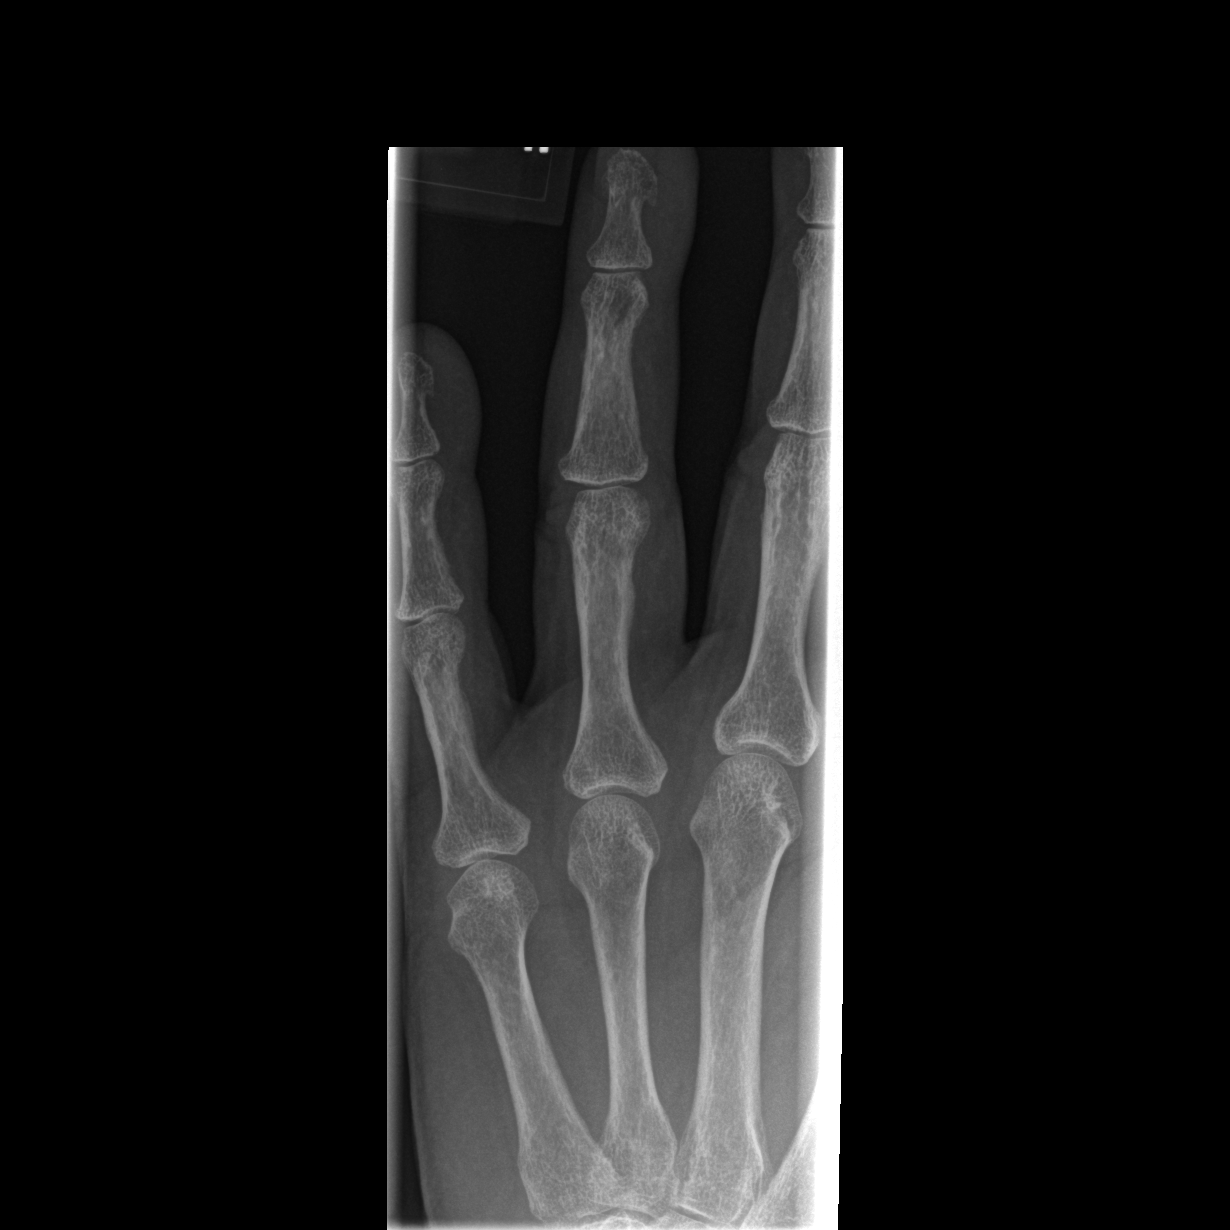

[x finger obl. left]
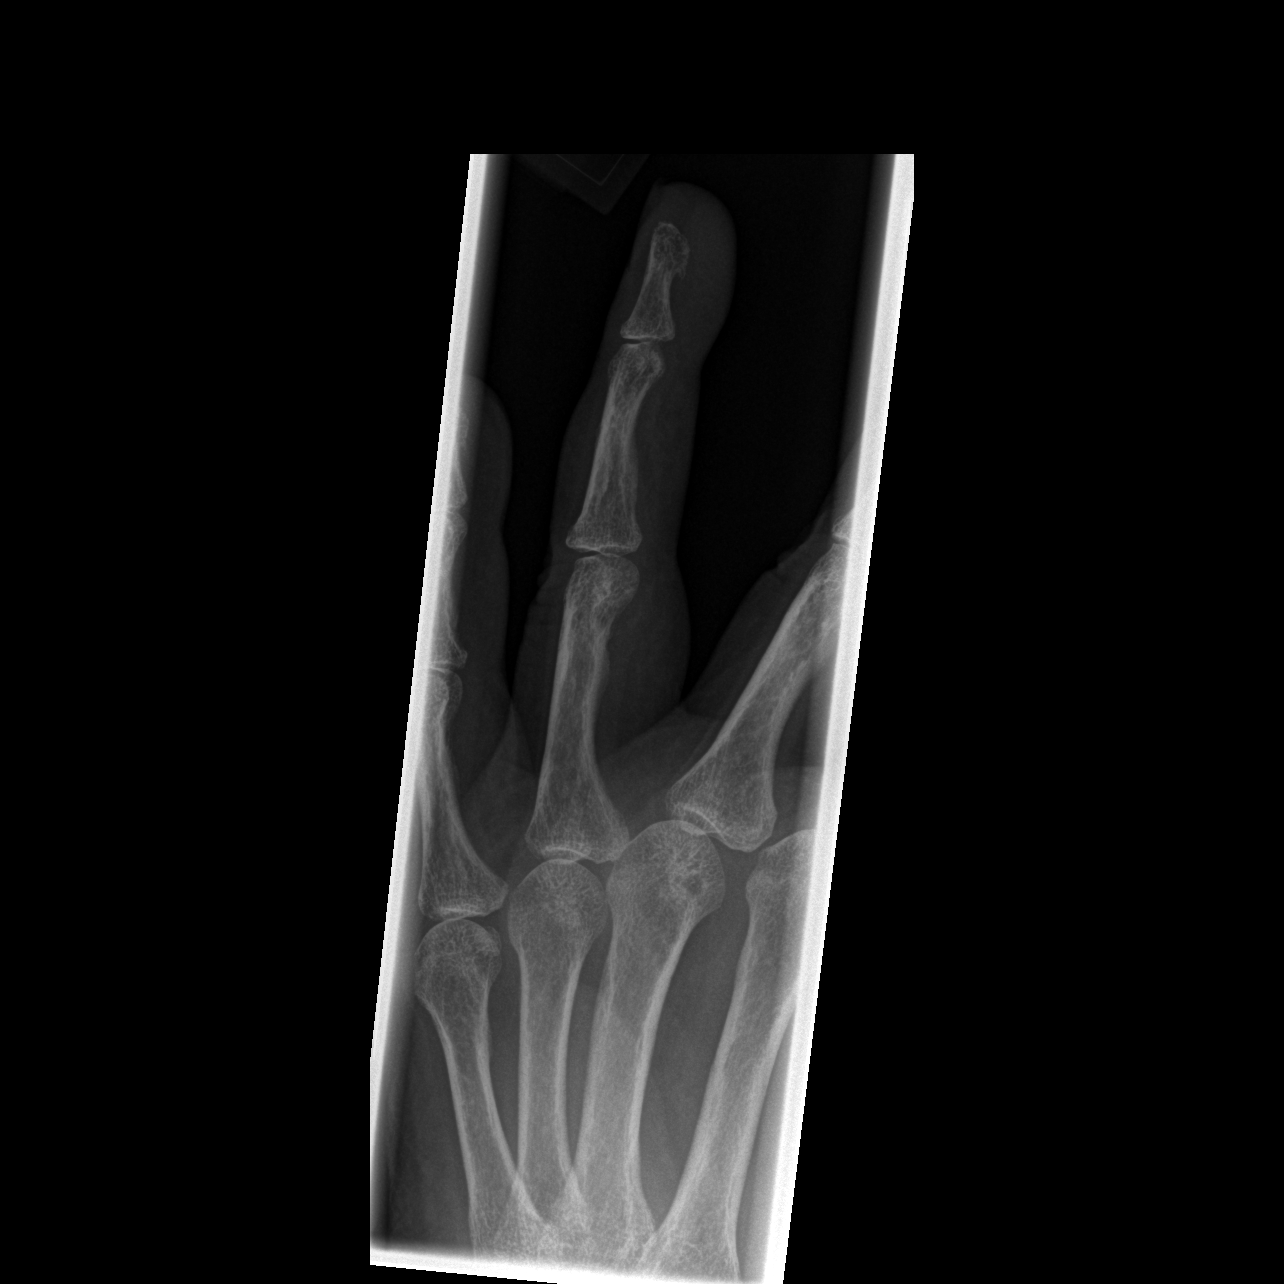

[x finger lateral left]
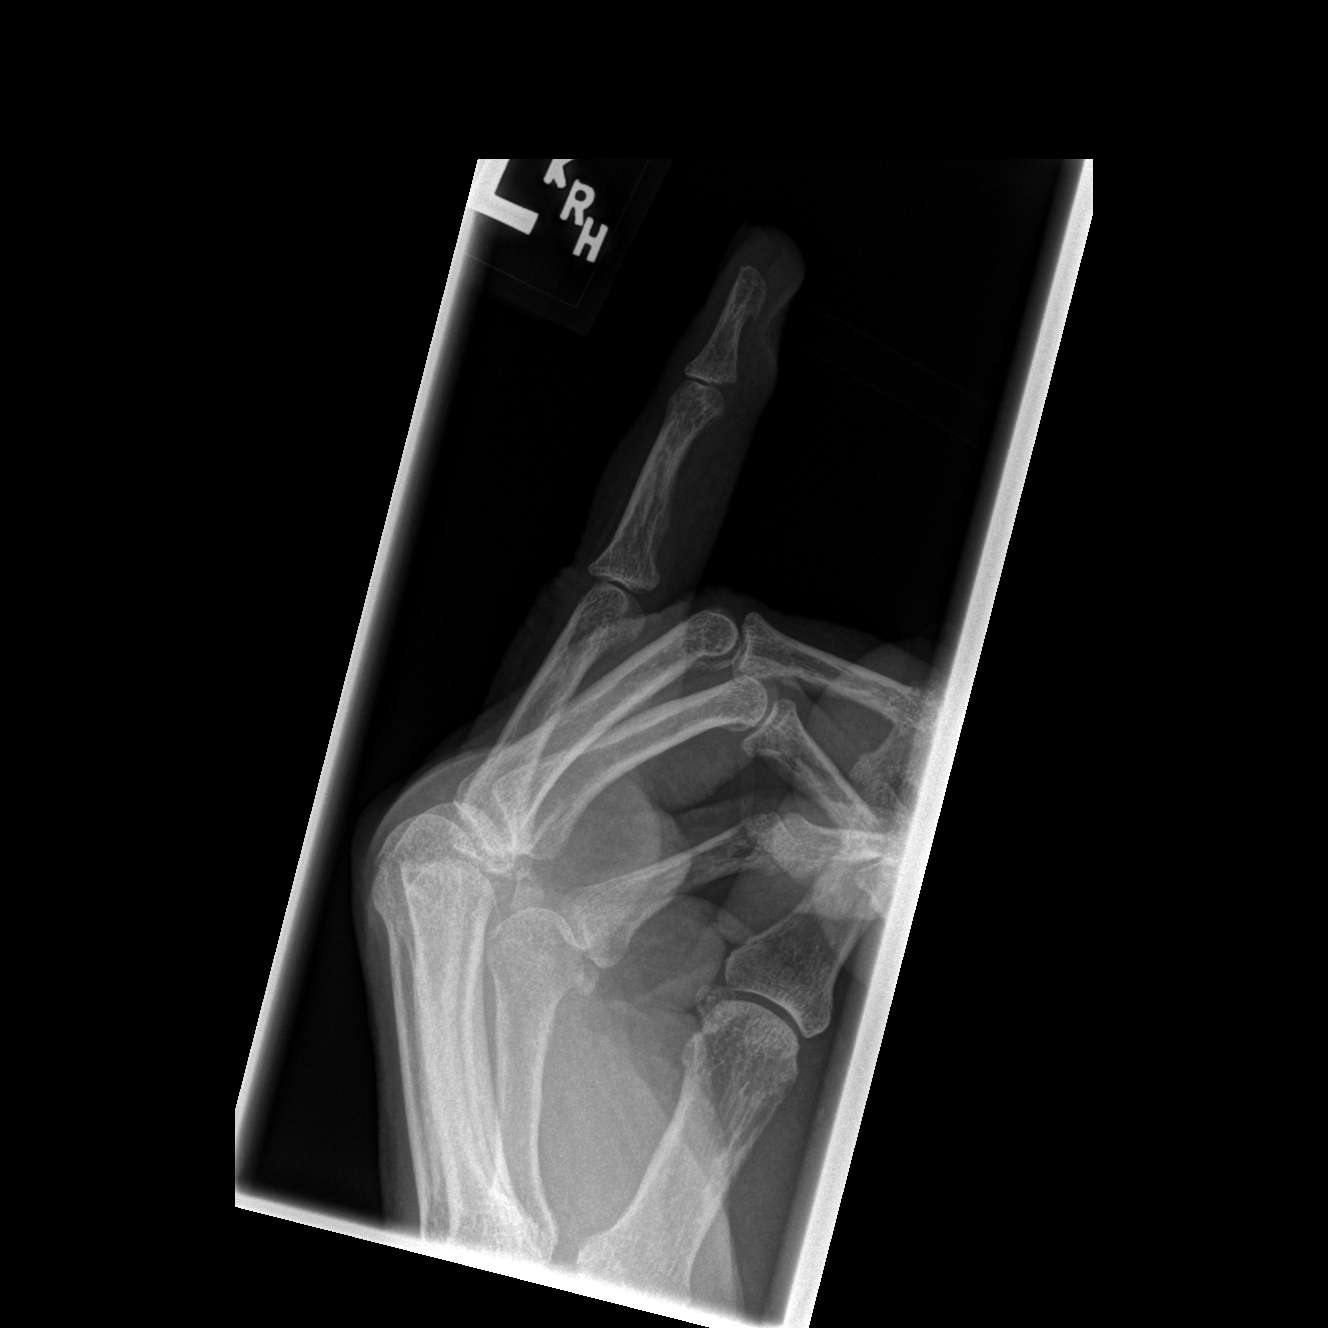

[3 of 3 positions shown; findings below may reference images not displayed]

FINDINGS: The bones are mildly demineralized. No acute fracture or dislocation
is seen. There is some irregularity of the volar plate of the distal
phalanx on the lateral view, possibly an erosion. No focal soft
tissue abnormalities are seen.
IMPRESSION: No suspected acute osseous findings. Possible erosion involving the
base of the distal phalanx.
